# Patient Record
Sex: Female | Born: 1995 | Race: Black or African American | Hispanic: No | Marital: Single | Smoking: Former smoker
Health system: Southern US, Community
[De-identification: ages and names within clinical notes are randomized; demographics above are authoritative.]

## PROBLEM LIST (undated history)

## (undated) ENCOUNTER — Inpatient Hospital Stay (HOSPITAL_COMMUNITY): Payer: Self-pay

## (undated) DIAGNOSIS — O24419 Gestational diabetes mellitus in pregnancy, unspecified control: Secondary | ICD-10-CM

## (undated) DIAGNOSIS — N760 Acute vaginitis: Secondary | ICD-10-CM

## (undated) DIAGNOSIS — F909 Attention-deficit hyperactivity disorder, unspecified type: Secondary | ICD-10-CM

## (undated) DIAGNOSIS — E669 Obesity, unspecified: Secondary | ICD-10-CM

## (undated) DIAGNOSIS — L0591 Pilonidal cyst without abscess: Secondary | ICD-10-CM

## (undated) HISTORY — DX: Attention-deficit hyperactivity disorder, unspecified type: F90.9

## (undated) HISTORY — PX: NO PAST SURGERIES: SHX2092

## (undated) HISTORY — PX: PILONIDAL CYST EXCISION: SHX744

## (undated) NOTE — *Deleted (*Deleted)
Postpartum Discharge Summary  Date of Service updated***     Patient Name: Kelly Willis DOB: 1995/11/25 MRN: [id number]  Date of admission: 12/08/2019 Delivery date:12/08/2019  Delivering provider: PACE, Perlie Gold C  Date of discharge: 12/08/2019  Admitting diagnosis: Normal labor [O80, Z37.9] Intrauterine pregnancy: [redacted]w[redacted]d     Secondary diagnosis:  Active Problems:   Normal labor  Additional problems:     Discharge diagnosis: Term Pregnancy Delivered                                              Post partum procedures:NA Augmentation: NA Complications: None  Hospital course: Onset of Labor With Vaginal Delivery      37 y.o. yo [name] at [redacted]w[redacted]d was admitted in Active Labor on 12/08/2019. Patient had an uncomplicated labor course as follows:  Membrane Rupture Time/Date: 12:41 PM ,12/08/2019   Delivery Method:Vaginal, Spontaneous  Episiotomy:  NA Lacerations:    Small perineal, hemostatic and not repaired Patient had an uncomplicated postpartum course.  She is ambulating, tolerating a regular diet, passing flatus, and urinating well. Patient is discharged home in stable condition on 12/08/19.  Newborn Data: Birth date:12/08/2019  Birth time:5:52 PM  Gender:Female  Living status:Living  Apgars: , 9, 9  Weight:   Magnesium Sulfate received: No BMZ received: {BMZ received:30440023} Rhophylac:{Rhophylac received:30440032} JYN:{WGN:56213086} T-DaP:Not given while pregnant Flu: Not given during pregnancy Transfusion:{Transfusion received:30440034}  Physical exam  Vitals:   12/08/19 1759 12/08/19 1803 12/08/19 1814 12/08/19 1818  BP: 103/89 105/72 (!) 155/80 (!) 148/73  Pulse: 83 95 97 88  Resp:    16  Temp:    99 F (37.2 C)  TempSrc:    Axillary  SpO2:      Weight:      Height:       General: {Exam; general:21111117} Lochia: {Desc; appropriate/inappropriate:30686::"appropriate"} Uterine Fundus: {Desc; firm/soft:30687} Incision: {Exam; incision:21111123}  DVT Evaluation: {Exam; dvt:2111122} Labs: Lab Results  Component Value Date   WBC 8.8 12/08/2019   HGB 11.3 (L) 12/08/2019   HCT 37.2 12/08/2019   MCV 88.2 12/08/2019   PLT 257 12/08/2019   CMP Latest Ref Rng & Units 09/17/2019  Glucose 65 - 99 mg/dL 578(I)  BUN 6 - 20 mg/dL 7  Creatinine 6.96 - 2.95 mg/dL 2.84  Sodium 132 - 440 mmol/L 135  Potassium 3.5 - 5.2 mmol/L 3.8  Chloride 96 - 106 mmol/L 103  CO2 20 - 29 mmol/L 21  Calcium 8.7 - 10.2 mg/dL 8.9  Total Protein 6.0 - 8.5 g/dL 6.6  Total Bilirubin 0.0 - 1.2 mg/dL 0.3  Alkaline Phos 48 - 121 IU/L 121  AST 0 - 40 IU/L 18  ALT 0 - 32 IU/L 17   Edinburgh Score: Edinburgh Postnatal Depression Scale Screening Tool 01/04/2018  I have been able to laugh and see the funny side of things. 1  I have looked forward with enjoyment to things. 0  I have blamed myself unnecessarily when things went wrong. 3  I have been anxious or worried for no good reason. 2  I have felt scared or panicky for no good reason. 1  Things have been getting on top of me. 2  I have been so unhappy that I have had difficulty sleeping. 2  I have felt sad or miserable. 2  I have been so unhappy that I have been crying. 2  The thought of harming myself has occurred to me. 0  Edinburgh Postnatal Depression Scale Total 15     After visit meds:  Allergies as of 12/08/2019      Reactions   Pepto-bismol [bismuth Subsalicylate] Nausea And Vomiting    Med Rec must be completed prior to using this [facility]***        Discharge home in stable condition Infant Feeding: {Baby feeding:23562} Infant Disposition:{CHL IP OB HOME WITH ZOXWRU:04540} Discharge instruction: per After Visit Summary and Postpartum booklet. Activity: Advance as tolerated. Pelvic rest for 6 weeks.  Diet: {OB JWJX:91478295} Future Appointments:No future appointments. Follow up Visit:   Please schedule this patient for a In person postpartum visit in 4 weeks with the following  provider: MD. Additional Postpartum F/U:BP check 1 week  High risk pregnancy complicated by: HTN Delivery mode:  Vaginal, Spontaneous  Anticipated Birth Control:  desires BTL   12/08/2019 Kelly Willis, CNM

---
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
## 2014-06-12 DIAGNOSIS — L0591 Pilonidal cyst without abscess: Secondary | ICD-10-CM

## 2014-06-12 HISTORY — DX: Pilonidal cyst without abscess: L05.91

## 2014-07-04 ENCOUNTER — Encounter (HOSPITAL_BASED_OUTPATIENT_CLINIC_OR_DEPARTMENT_OTHER): Payer: Self-pay | Admitting: *Deleted

## 2014-07-08 ENCOUNTER — Ambulatory Visit (HOSPITAL_BASED_OUTPATIENT_CLINIC_OR_DEPARTMENT_OTHER): Payer: 59 | Admitting: Anesthesiology

## 2014-07-08 ENCOUNTER — Emergency Department (HOSPITAL_COMMUNITY)
Admission: EM | Admit: 2014-07-08 | Discharge: 2014-07-09 | Disposition: A | Discharge status: Home / Self Care | Payer: 59 | Attending: Emergency Medicine | Admitting: Emergency Medicine

## 2014-07-08 ENCOUNTER — Encounter (HOSPITAL_BASED_OUTPATIENT_CLINIC_OR_DEPARTMENT_OTHER): Payer: Self-pay | Admitting: Anesthesiology

## 2014-07-08 ENCOUNTER — Encounter (HOSPITAL_BASED_OUTPATIENT_CLINIC_OR_DEPARTMENT_OTHER)
Admission: RE | Disposition: A | Discharge status: Home / Self Care | Payer: Self-pay | Source: Ambulatory Visit | Attending: General Surgery

## 2014-07-08 ENCOUNTER — Ambulatory Visit (HOSPITAL_BASED_OUTPATIENT_CLINIC_OR_DEPARTMENT_OTHER)
Admission: RE | Admit: 2014-07-08 | Discharge: 2014-07-08 | Disposition: A | Discharge status: Home / Self Care | Payer: 59 | Source: Ambulatory Visit | Attending: General Surgery | Admitting: General Surgery

## 2014-07-08 ENCOUNTER — Encounter (HOSPITAL_COMMUNITY): Payer: Self-pay | Admitting: Emergency Medicine

## 2014-07-08 DIAGNOSIS — Y838 Other surgical procedures as the cause of abnormal reaction of the patient, or of later complication, without mention of misadventure at the time of the procedure: Secondary | ICD-10-CM | POA: Diagnosis not present

## 2014-07-08 DIAGNOSIS — L7621 Postprocedural hemorrhage and hematoma of skin and subcutaneous tissue following a dermatologic procedure: Secondary | ICD-10-CM | POA: Diagnosis not present

## 2014-07-08 DIAGNOSIS — Z720 Tobacco use: Secondary | ICD-10-CM | POA: Diagnosis not present

## 2014-07-08 DIAGNOSIS — L7622 Postprocedural hemorrhage and hematoma of skin and subcutaneous tissue following other procedure: Secondary | ICD-10-CM

## 2014-07-08 DIAGNOSIS — E669 Obesity, unspecified: Secondary | ICD-10-CM | POA: Insufficient documentation

## 2014-07-08 DIAGNOSIS — L0591 Pilonidal cyst without abscess: Secondary | ICD-10-CM | POA: Insufficient documentation

## 2014-07-08 HISTORY — PX: PILONIDAL CYST EXCISION: SHX744

## 2014-07-08 HISTORY — DX: Pilonidal cyst without abscess: L05.91

## 2014-07-08 LAB — POCT HEMOGLOBIN-HEMACUE: HEMOGLOBIN: 12.4 g/dL (ref 12.0–15.0)

## 2014-07-08 SURGERY — EXCISION, PILONIDAL CYST, EXTENSIVE
Anesthesia: General | Site: Buttocks

## 2014-07-08 MED ORDER — FENTANYL CITRATE (PF) 100 MCG/2ML IJ SOLN
INTRAMUSCULAR | Status: DC | PRN
  Administered 2014-07-08 (×4): 50 ug via INTRAVENOUS

## 2014-07-08 MED ORDER — SUCCINYLCHOLINE CHLORIDE 20 MG/ML IJ SOLN
INTRAMUSCULAR | Status: DC | PRN
  Administered 2014-07-08: 50 mg via INTRAVENOUS

## 2014-07-08 MED ORDER — SODIUM CHLORIDE 0.9 % IJ SOLN: 3.0000 mL | INTRAMUSCULAR | Status: DC | PRN

## 2014-07-08 MED ORDER — ONDANSETRON HCL 4 MG/2ML IJ SOLN: 4.0000 mg | Freq: Once | INTRAMUSCULAR | Status: DC | PRN

## 2014-07-08 MED ORDER — OXYCODONE HCL 5 MG PO TABS: 5.0000 mg | ORAL_TABLET | Freq: Once | ORAL | Status: DC | PRN

## 2014-07-08 MED ORDER — HYDROMORPHONE HCL 1 MG/ML IJ SOLN
0.2500 mg | INTRAMUSCULAR | Status: DC | PRN
  Administered 2014-07-08 (×2): 0.5 mg via INTRAVENOUS

## 2014-07-08 MED ORDER — BUPIVACAINE HCL (PF) 0.5 % IJ SOLN
INTRAMUSCULAR | Status: DC | PRN
  Administered 2014-07-08: 10 mL

## 2014-07-08 MED ORDER — SUCCINYLCHOLINE CHLORIDE 20 MG/ML IJ SOLN
INTRAMUSCULAR | Status: AC
  Filled 2014-07-08: qty 3

## 2014-07-08 MED ORDER — OXYCODONE HCL 5 MG PO TABS: 5.0000 mg | ORAL_TABLET | ORAL | Status: DC | PRN

## 2014-07-08 MED ORDER — ACETAMINOPHEN 650 MG RE SUPP: 650.0000 mg | RECTAL | Status: DC | PRN

## 2014-07-08 MED ORDER — CEFAZOLIN SODIUM-DEXTROSE 2-3 GM-% IV SOLR
INTRAVENOUS | Status: DC | PRN
  Administered 2014-07-08: 2 g via INTRAVENOUS

## 2014-07-08 MED ORDER — ALBUTEROL SULFATE HFA 108 (90 BASE) MCG/ACT IN AERS
INHALATION_SPRAY | RESPIRATORY_TRACT | Status: DC | PRN
  Administered 2014-07-08: 2 via RESPIRATORY_TRACT

## 2014-07-08 MED ORDER — BUPIVACAINE HCL (PF) 0.5 % IJ SOLN
INTRAMUSCULAR | Status: AC
  Filled 2014-07-08: qty 30

## 2014-07-08 MED ORDER — LIDOCAINE HCL (CARDIAC) 20 MG/ML IV SOLN
INTRAVENOUS | Status: DC | PRN
  Administered 2014-07-08: 50 mg via INTRAVENOUS

## 2014-07-08 MED ORDER — LACTATED RINGERS IV SOLN
INTRAVENOUS | Status: DC
  Administered 2014-07-08 (×2): via INTRAVENOUS

## 2014-07-08 MED ORDER — HYDROMORPHONE HCL 1 MG/ML IJ SOLN
INTRAMUSCULAR | Status: AC
  Filled 2014-07-08: qty 1

## 2014-07-08 MED ORDER — MIDAZOLAM HCL 5 MG/5ML IJ SOLN
INTRAMUSCULAR | Status: DC | PRN
  Administered 2014-07-08: 2 mg via INTRAVENOUS

## 2014-07-08 MED ORDER — MIDAZOLAM HCL 2 MG/2ML IJ SOLN
INTRAMUSCULAR | Status: AC
  Filled 2014-07-08: qty 2

## 2014-07-08 MED ORDER — DEXAMETHASONE SODIUM PHOSPHATE 4 MG/ML IJ SOLN
INTRAMUSCULAR | Status: DC | PRN
  Administered 2014-07-08: 10 mg via INTRAVENOUS

## 2014-07-08 MED ORDER — PROPOFOL 10 MG/ML IV BOLUS
INTRAVENOUS | Status: DC | PRN
  Administered 2014-07-08: 200 mg via INTRAVENOUS

## 2014-07-08 MED ORDER — ACETAMINOPHEN 325 MG PO TABS: 650.0000 mg | ORAL_TABLET | ORAL | Status: DC | PRN

## 2014-07-08 MED ORDER — BUPIVACAINE HCL (PF) 0.25 % IJ SOLN
INTRAMUSCULAR | Status: AC
  Filled 2014-07-08: qty 30

## 2014-07-08 MED ORDER — FENTANYL CITRATE (PF) 100 MCG/2ML IJ SOLN
INTRAMUSCULAR | Status: AC
  Filled 2014-07-08: qty 6

## 2014-07-08 MED ORDER — MORPHINE SULFATE 2 MG/ML IJ SOLN: 2.0000 mg | INTRAMUSCULAR | Status: DC | PRN

## 2014-07-08 SURGICAL SUPPLY — 48 items
BENZOIN TINCTURE PRP APPL 2/3 (GAUZE/BANDAGES/DRESSINGS) IMPLANT
BLADE SURG 15 STRL LF DISP TIS (BLADE) ×1 IMPLANT
BLADE SURG 15 STRL SS (BLADE) ×2
BNDG GAUZE ELAST 4 BULKY (GAUZE/BANDAGES/DRESSINGS) ×3 IMPLANT
CANISTER SUCT 1200ML W/VALVE (MISCELLANEOUS) ×3 IMPLANT
CHLORAPREP W/TINT 26ML (MISCELLANEOUS) ×3 IMPLANT
CLEANER CAUTERY TIP 5X5 PAD (MISCELLANEOUS) ×1 IMPLANT
COVER BACK TABLE 60X90IN (DRAPES) ×3 IMPLANT
COVER MAYO STAND STRL (DRAPES) ×3 IMPLANT
DECANTER SPIKE VIAL GLASS SM (MISCELLANEOUS) IMPLANT
DRAIN CHANNEL 10F 3/8 F FF (DRAIN) IMPLANT
DRAIN PENROSE 1/4X12 LTX STRL (WOUND CARE) IMPLANT
DRAPE LAPAROTOMY T 102X78X121 (DRAPES) ×3 IMPLANT
DRAPE UTILITY XL STRL (DRAPES) ×3 IMPLANT
DRSG PAD ABDOMINAL 8X10 ST (GAUZE/BANDAGES/DRESSINGS) ×6 IMPLANT
ELECT REM PT RETURN 9FT ADLT (ELECTROSURGICAL) ×3
ELECTRODE REM PT RTRN 9FT ADLT (ELECTROSURGICAL) ×1 IMPLANT
EVACUATOR SILICONE 100CC (DRAIN) IMPLANT
GAUZE SPONGE 4X4 16PLY XRAY LF (GAUZE/BANDAGES/DRESSINGS) IMPLANT
GLOVE BIOGEL PI IND STRL 8.5 (GLOVE) ×1 IMPLANT
GLOVE BIOGEL PI INDICATOR 8.5 (GLOVE) ×2
GLOVE SURG SS PI 7.0 STRL IVOR (GLOVE) ×3 IMPLANT
GOWN STRL REUS W/ TWL LRG LVL3 (GOWN DISPOSABLE) ×1 IMPLANT
GOWN STRL REUS W/TWL LRG LVL3 (GOWN DISPOSABLE) ×2
NEEDLE HYPO 22GX1.5 SAFETY (NEEDLE) ×3 IMPLANT
NS IRRIG 1000ML POUR BTL (IV SOLUTION) ×3 IMPLANT
PACK BASIN DAY SURGERY FS (CUSTOM PROCEDURE TRAY) ×3 IMPLANT
PAD CLEANER CAUTERY TIP 5X5 (MISCELLANEOUS) ×2
PENCIL BUTTON HOLSTER BLD 10FT (ELECTRODE) ×3 IMPLANT
SPONGE GAUZE 4X4 12PLY STER LF (GAUZE/BANDAGES/DRESSINGS) IMPLANT
SUCTION FRAZIER TIP 10 FR DISP (SUCTIONS) IMPLANT
SUT CHROMIC 3 0 SH 27 (SUTURE) ×3 IMPLANT
SUT ETHILON 2 0 FS 18 (SUTURE) IMPLANT
SUT ETHILON 3 0 FSL (SUTURE) IMPLANT
SUT ETHILON 4 0 PS 2 18 (SUTURE) IMPLANT
SUT MNCRL AB 3-0 PS2 18 (SUTURE) IMPLANT
SUT MON AB 2-0 CT1 36 (SUTURE) IMPLANT
SUT VIC AB 4-0 SH 18 (SUTURE) IMPLANT
SUT VICRYL 4-0 PS2 18IN ABS (SUTURE) ×3 IMPLANT
SWAB COLLECTION DEVICE MRSA (MISCELLANEOUS) IMPLANT
SYR BULB 3OZ (MISCELLANEOUS) ×3 IMPLANT
SYR CONTROL 10ML LL (SYRINGE) ×3 IMPLANT
TAPE CLOTH 3X10 TAN LF (GAUZE/BANDAGES/DRESSINGS) IMPLANT
TOWEL OR 17X24 6PK STRL BLUE (TOWEL DISPOSABLE) ×3 IMPLANT
TOWEL OR NON WOVEN STRL DISP B (DISPOSABLE) ×3 IMPLANT
TUBE CONNECTING 20'X1/4 (TUBING) ×1
TUBE CONNECTING 20X1/4 (TUBING) ×2 IMPLANT
YANKAUER SUCT BULB TIP NO VENT (SUCTIONS) ×3 IMPLANT

## 2014-07-08 NOTE — Anesthesia Postprocedure Evaluation (Signed)
Anesthesia Post Note  Patient: Kelly Willis  Procedure(s) Performed: Procedure(s) (LRB): CYST EXCISION PILONIDAL  (N/A)  Anesthesia type: General  Patient location: PACU  Post pain: Pain level controlled and Adequate analgesia  Post assessment: Post-op Vital signs reviewed, Patient's Cardiovascular Status Stable, Respiratory Function Stable, Patent Airway and Pain level controlled  Last Vitals:  Filed Vitals:   07/08/14 1345  BP: 131/82  Pulse: 93  Temp:   Resp: 19    Post vital signs: Reviewed and stable  Level of consciousness: awake, alert  and oriented  Complications: No apparent anesthesia complications

## 2014-07-08 NOTE — H&P (Signed)
Fatima SangerChaneldra Kosloski  The patient is a 19 year old female   Note:She has recurrent, infected pilonidal cyst disease and presents for elective pilonidal cystectomy.   Allergies  Pepto Bismol *ANTIDIARRHEALS* Nausea, Vomiting.  Prior to Admission medications   Not on File      Physical Exam  The physical exam findings are as follows: Note:General-morbidly obese female in no acute distress.  Gluteal cleft-open wound to the right of the midline is healed. There is some firmness present.    Assessment & Plan  PILONIDAL ABSCESS (685.0  L05.01) Impression: She has been having problems with recurrent infected pilonidal cysts/recurrent pilonidal abscess. She is in agreement with proceeding with extensive pilonidal cystectomy.  Plan: Extensive pilonidal cystectomy with healing by secondary intention. We went over the procedure and risks. The risks include but are not limited to bleeding, recurrent infection, recurrent pilonidal cyst disease, anesthesia, problems with wound healing. They all seem to understand this and agree with the plan.    Signed by Adolph Pollackodd J Deztiny Sarra, MD

## 2014-07-08 NOTE — Anesthesia Procedure Notes (Signed)
Procedure Name: Intubation Date/Time: 07/08/2014 12:16 PM Performed by: Genevieve NorlanderLINKA, Jessie Schrieber L Pre-anesthesia Checklist: Emergency Drugs available, Suction available, Patient being monitored, Patient identified and Timeout performed Patient Re-evaluated:Patient Re-evaluated prior to inductionOxygen Delivery Method: Circle System Utilized Preoxygenation: Pre-oxygenation with 100% oxygen Intubation Type: IV induction Ventilation: Mask ventilation without difficulty Laryngoscope Size: Miller and 2 Grade View: Grade II Tube type: Oral Number of attempts: 1 Airway Equipment and Method: Stylet and Oral airway Placement Confirmation: ETT inserted through vocal cords under direct vision,  positive ETCO2 and breath sounds checked- equal and bilateral Secured at: 21.5 cm Tube secured with: Tape Dental Injury: Teeth and Oropharynx as per pre-operative assessment

## 2014-07-08 NOTE — Op Note (Signed)
Operative Note  Kelly SangerChaneldra Arango female 19 y.o. 07/08/2014  PREOPERATIVE DX:  Recurrent infected pilonidal cyst  POSTOPERATIVE DX:  Same  PROCEDURE:   Extensive pilonidal cystectomy         Surgeon: Adolph PollackOSENBOWER,Shelitha Magley J   Assistants: none  Anesthesia: General endotracheal anesthesia  Indications:   This is a 19 year old female who is morbidly obese and has recurrent infected pilonidal cyst disease. She now presents for extensive pilonidal cystectomy with healing by secondary intention planned.    Procedure Detail:  She was seen in the holding area. She is brought to the operating room on the stretcher and the general anesthetic was administered. She was then turned prone on the operating table in padding placed appropriately. The buttocks and gluteal cleft area were sterilely prepped and draped.  Firm indurated area could be felt in the upper portion of the gluteal cleft small sinus tracts noted and a small incision just to the right of the midline. Local anesthetic consisting of half percent plain Marcaine was infiltrated in the skin and subcutaneous tissues around the gluteal cleft. An elliptical incision was made through the skin and subcutaneous tissue to valve the majority of the gluteal cleft ending proximal to the anal area. I dissected the subcutaneous tissue with electrocautery. There was firm scar and abnormal tissue all the way down to the fascia. This was 7 cm deep. I then excised all this tissue with electrocautery back to normal-appearing tissue. This specimen was sent to pathology. More local anesthetic was injected into the deep portions of the wound. Bleeding was controlled with electrocautery.  Once hemostasis was adequate, the wound was packed with an entire saline moistened Kerlix gauze. Montgomery straps were applied followed by dry dressings.  She tolerated the procedure without any apparent complications and was taken to the recovery room in satisfactory  condition.  Estimated Blood Loss:  150 ml         Specimens: pilonidal tissue        Complications:  * No complications entered in OR log *         Disposition: PACU - hemodynamically stable.         Condition: stable

## 2014-07-08 NOTE — Anesthesia Preprocedure Evaluation (Signed)
Anesthesia Evaluation  Patient identified by MRN, date of birth, ID band Patient awake    Reviewed: Allergy & Precautions, NPO status , Patient's Chart, lab work & pertinent test results  Airway Mallampati: II  TM Distance: >3 FB Neck ROM: Full    Dental  (+) Teeth Intact, Dental Advisory Given   Pulmonary Current Smoker,  breath sounds clear to auscultation        Cardiovascular Rhythm:Regular Rate:Normal     Neuro/Psych    GI/Hepatic   Endo/Other    Renal/GU      Musculoskeletal   Abdominal (+) + obese,   Peds  Hematology   Anesthesia Other Findings   Reproductive/Obstetrics                             Anesthesia Physical Anesthesia Plan  ASA: II  Anesthesia Plan: General   Post-op Pain Management:    Induction: Intravenous  Airway Management Planned: Oral ETT  Additional Equipment:   Intra-op Plan:   Post-operative Plan:   Informed Consent: I have reviewed the patients History and Physical, chart, labs and discussed the procedure including the risks, benefits and alternatives for the proposed anesthesia with the patient or authorized representative who has indicated his/her understanding and acceptance.   Dental advisory given  Plan Discussed with: CRNA and Anesthesiologist  Anesthesia Plan Comments: (Pilonidal cyst Obesity  Plan GA with oral ETT  Kelly Willis)        Anesthesia Quick Evaluation

## 2014-07-08 NOTE — Discharge Instructions (Addendum)
[facility] _______[facility]  RECTAL AREA SURGERY POST OP INSTRUCTIONS: POST OP INSTRUCTIONS  Always review your discharge instruction sheet given to you by the facility where your surgery was performed. IF YOU HAVE DISABILITY OR FAMILY LEAVE FORMS, YOU MUST BRING THEM TO THE OFFICE FOR PROCESSING.   DO NOT GIVE THEM TO YOUR DOCTOR.  1. A  prescription for pain medication may be given to you upon discharge.  Take your pain medication as prescribed, if needed.  If narcotic pain medicine is not needed, then you may take acetaminophen (Tylenol) or ibuprofen (Advil) as needed. 2. Take your usually prescribed medications unless otherwise directed. 3. If you need a refill on your pain medication, please contact your pharmacy.  They will contact our office to request authorization. Prescriptions will not be filled after 5 pm or on week-ends. 4. You should follow a light diet the first 48 hours after arrival home, such as soup and crackers, etc.  Be sure to include lots of fluids daily.  Resume your normal diet 2-3 days after surgery.. 5. Most patients will experience some swelling and discomfort in the rectal area. Ice packs help.  Swelling and discomfort can take many days to resolve.  6. It is common to experience some constipation if taking pain medication after surgery.  Increasing fluid intake and taking a stool softener (such as Colace) will usually help or prevent this problem from occurring.  A mild laxative (Milk of Magnesia or Miralax) should be taken according to package directions if there are no bowel movements after 48 hours. 7. Unless discharge instructions indicate otherwise, leave your bandage dry and in place until your office visit. 8. ACTIVITIES:  You may resume light daily activities beginning the next day--such as daily self-care, walking, climbing stairs-- as tolerated.  You may have sexual intercourse when it is comfortable.  Refrain from any heavy lifting or straining  until approved by your doctor. a. You may drive when you are no longer taking prescription pain medication, you can comfortably wear a seatbelt, and you can safely maneuver your car and apply brakes. b. RETURN TO WORK: : When released by doctor____________________ c.  9. You should see your doctor in the office for a follow-up appointment Tuesday, May 31 at 10:00 AM. 10. OTHER INSTRUCTIONS:  __________________________________________________________________________________________________________________________________________________________________________________________  WHEN TO CALL YOUR DOCTOR: 1. Fever over 101.0 2. Inability to urinate 3. Nausea and/or vomiting 4. Extreme swelling or bruising 5. Heavy bleeding from wound. 6. Increased pain, redness, or drainage from the incision 7. Constipation  The clinic staff is available to answer your questions during regular business hours.  Please dont hesitate to call and ask to speak to one of the nurses for clinical concerns.  If you have a medical emergency, go to the nearest emergency room or call 911.  [facility] is always on call at the hospital   [address], [address], [place], [place]  [id number] ?  [address] [address], [place], [place]   [id number] [phone] ? [phone] ? FAX [phone]) [phone] Web site: [email or web address]    Post Anesthesia Home Care Instructions  Activity: Get plenty of rest for the remainder of the day. A responsible adult should stay with you for 24 hours following the procedure.  For the next 24 hours, DO NOT: -Drive a car -Advertising copywriterperate machinery -Drink alcoholic beverages -Take any medication unless instructed by your physician -Make any legal decisions or sign important papers.  Meals: Start with liquid  foods such as gelatin or soup. Progress to regular foods as tolerated. Avoid greasy, spicy, heavy foods. If nausea and/or vomiting occur, drink only clear  liquids until the nausea and/or vomiting subsides. Call your physician if vomiting continues.  Special Instructions/Symptoms: Your throat may feel dry or sore from the anesthesia or the breathing tube placed in your throat during surgery. If this causes discomfort, gargle with warm salt water. The discomfort should disappear within 24 hours.  If you had a scopolamine patch placed behind your ear for the management of post- operative nausea and/or vomiting:  1. The medication in the patch is effective for 72 hours, after which it should be removed.  Wrap patch in a tissue and discard in the trash. Wash hands thoroughly with soap and water. 2. You may remove the patch earlier than 72 hours if you experience unpleasant side effects which may include dry mouth, dizziness or visual disturbances. 3. Avoid touching the patch. Wash your hands with soap and water after contact with the patch.

## 2014-07-08 NOTE — Transfer of Care (Signed)
Immediate Anesthesia Transfer of Care Note  Patient: Kelly Willis  Procedure(s) Performed: Procedure(s): CYST EXCISION PILONIDAL  (N/A)  Patient Location: PACU  Anesthesia Type:General  Level of Consciousness: awake and patient cooperative  Airway & Oxygen Therapy: Patient Spontanous Breathing and Patient connected to face mask oxygen  Post-op Assessment: Report given to RN and Post -op Vital signs reviewed and stable  Post vital signs: Reviewed and stable  Last Vitals:  Filed Vitals:   07/08/14 0830  BP: 134/69  Pulse: 79  Temp: 36.8 C  Resp: 20    Complications: No apparent anesthesia complications

## 2014-07-08 NOTE — ED Notes (Signed)
Pt arrived to the ED with a complaint of a post operative problem.  Pt had a cyst removed from her hip.  Pt states area has not stopped bleeding.  Pt states that she also has noticed blood in her eye.

## 2014-07-08 NOTE — Interval H&P Note (Signed)
History and Physical Interval Note:  07/08/2014 12:04 PM  Kelly Willis  has presented today for surgery, with the diagnosis of PILONIDAL CYST  The various methods of treatment have been discussed with the patient and family. After consideration of risks, benefits and other options for treatment, the patient has consented to  Procedure(s): CYST EXCISION PILONIDAL  (N/A) as a surgical intervention .  The patient's history has been reviewed, patient examined, no change in status, stable for surgery.  I have reviewed the patient's chart and labs.  Questions were answered to the patient's satisfaction.     Michaeljohn Biss JShela Commons

## 2014-07-09 NOTE — ED Provider Notes (Signed)
CSN: [id number]     Arrival date & time 07/08/14  2329 History   First MD Initiated Contact with Patient 07/09/14 0140     Chief Complaint  Patient presents with  . Post-op Problem     (Consider location/radiation/quality/duration/timing/severity/associated sxs/prior Treatment) HPI  Kelly Willis is a 19 y.o. female with past medical history of a pilonidal abscess status post resection earlier today presenting with bleeding. Patient states the bleeding never stopped after the same day surgery. She has bled through the dressings into her underwear. She denies any significant pain in the area. She is concerned for major postoperative bleeding. Patient also complains of bleeding in her right eye which appeared around 9:30 PM. She states she did have multiple episodes of coughing due to a sore throat from intubation. There is no pain in the eye or blurry vision. She has no neurological symptoms. She has no further complaints.  10 Systems reviewed and are negative for acute change except as noted in the HPI.    Past Medical History  Diagnosis Date  . Obesity   . Pilonidal cyst 06/2014   Past Surgical History  Procedure Laterality Date  . Pilonidal cyst excision     History reviewed. No pertinent family history. History  Substance Use Topics  . Smoking status: Current Every Day Smoker -- 2 years    Types: Cigarettes  . Smokeless tobacco: Never Used     Comment: 4 cig./day  . Alcohol Use: Yes     Comment: occasionally   OB History    Gravida Para Term Preterm AB TAB SAB Ectopic Multiple Living   1              Review of Systems    Allergies  Pepto-bismol  Home Medications   Prior to Admission medications   Medication Sig Start Date End Date Taking? Authorizing Provider  oxyCODONE (OXY IR/ROXICODONE) 5 MG immediate release tablet Take 1-2 tablets (5-10 mg total) by mouth every 4 (four) hours as needed. 07/08/14   Avel Peaceodd Rosenbower, MD   BP 141/85 mmHg  Pulse 111   Temp(Src) 98.3 F (36.8 C) (Oral)  Resp 18  SpO2 98%  LMP 06/28/2014 (Exact Date) Physical Exam  Constitutional: She is oriented to person, place, and time. She appears well-developed and well-nourished. No distress.  HENT:  Head: Normocephalic and atraumatic.  Nose: Nose normal.  Mouth/Throat: Oropharynx is clear and moist. No oropharyngeal exudate.  Eyes: Conjunctivae and EOM are normal. Pupils are equal, round, and reactive to light. No scleral icterus.  Right eye subconjunctival hemorrhage is seen at the 6:00 position.  Neck: Normal range of motion. Neck supple. No JVD present. No tracheal deviation present. No thyromegaly present.  Cardiovascular: Normal rate, regular rhythm and normal heart sounds.  Exam reveals no gallop and no friction rub.   No murmur heard. Pulmonary/Chest: Effort normal and breath sounds normal. No respiratory distress. She has no wheezes. She exhibits no tenderness.  Abdominal: Soft. Bowel sounds are normal. She exhibits no distension and no mass. There is no tenderness. There is no rebound and no guarding.  Musculoskeletal: Normal range of motion. She exhibits no edema or tenderness.  Lymphadenopathy:    She has no cervical adenopathy.  Neurological: She is alert and oriented to person, place, and time. No cranial nerve deficit. She exhibits normal muscle tone.  Skin: Skin is warm and dry. No rash noted. She is not diaphoretic. No erythema. No pallor.  Large 10cm wound with gauze dressing packed.  This was not removed.  There is good hemostasis and no evidence of active bleeding.  No significant TTP.  Nursing note and vitals reviewed.   ED Course  Procedures (including critical care time) Labs Review Labs Reviewed - No data to display  Imaging Review No results found.   EKG Interpretation None      MDM   Final diagnoses:  None   Patient presents to the ED for postop bleeding.  My exam reveals that this is now controlled with good hemostasis  from dressing.  Subconjunctival hemorrhage education given.  I spoke with Dr. Pollyann Kennedy who received a phone call from this patient earlier and advised them to come in.  I explained to him that I took down the dressing and there is good hemostasis present.  He also believes the patient is then safe for DC with fu on Tuesday in clinic.  Will give APD pads to use at home.  Return precautions given.  Her VS remain within her normal limits and she is safe for DC.    Tomasita Crumble, MD 07/09/14 [phone]

## 2014-07-09 NOTE — Discharge Instructions (Signed)
Pilonidal Cyst, Care After [name], your bleeding has stopped with the dressings reapplied. Change the top layer dressing only every day. See Dr. Kae Heller in clinic on Tuesday. If any symptoms worsen come back to the emergency department immediately. Thank you. A pilonidal cyst occurs when hairs get trapped (ingrown) beneath the skin in the crease between the buttocks over your sacrum (the bone under that crease). Pilonidal cysts are most common in young men with a lot of body hair. When the cyst breaks(ruptured) or leaks, fluid from the cyst may cause burning and itching. If the cyst becomes infected, it causes a painful swelling filled with pus (abscess). The pus and trapped hairs need to be removed (often by lancing) so that the infection can heal. The word pilonidal means hair nest. HOME CARE INSTRUCTIONS If the pilonidal sinus was NOT DRAINING OR LANCED:  Keep the area clean and dry. Bathe or shower daily. Wash the area well with a germ-killing soap. Hot tub baths may help prevent infection. Dry the area well with a towel.  Avoid tight clothing in order to keep area as moisture-free as possible.  Keep area between buttocks as free from hair as possible. A depilatory may be used.  Take antibiotics as directed.  Only take over-the-counter or prescription medicines for pain, discomfort, or fever as directed by your caregiver. If the cyst WAS INFECTED AND NEEDED TO BE DRAINED:  Your caregiver may have packed the wound with gauze to keep the wound open. This allows the wound to heal from the inside outward and continue to drain.  Return as directed for a wound check.  If you take tub baths or showers, repack the wound with gauze as directed following. Sponge baths are a good alternative. Sitz baths may be used three to four times a day or as directed.  If an antibiotic was ordered to fight the infection, take as directed.  Only take over-the-counter or prescription medicines for pain,  discomfort, or fever as directed by your caregiver.  If a drain was in place and removed, use sitz baths for 20 minutes 4 times per day. Clean the wound gently with mild unscented soap, pat dry, and then apply a dry dressing as directed. If you had surgery and IT WAS MARSUPIALIZED (LEFT OPEN):  Your wound was packed with gauze to keep the wound open. This allows the wound to heal from the inside outwards and continue draining. The changing of the dressing regularly also helps keep the wound clean.  Return as directed for a wound check.  If you take tub baths or showers, repack the wound with gauze as directed following. Sponge baths are a good alternative. Sitz baths can also be used. This may be done three to four times a day or as directed.  If an antibiotic was ordered to fight the infection, take as directed.  Only take over-the-counter or prescription medicines for pain, discomfort, or fever as directed by your caregiver.  If you had surgery and the wound was closed you may care for it as directed. This generally includes keeping it dry and clean and dressing it as directed. SEEK MEDICAL CARE IF:   You have increased pain, swelling, redness, drainage, or bleeding from the area.  You have a fever.  You have muscles aches, dizziness, or a general ill feeling. Document Released: 02/28/2006 Document Revised: 09/30/2012 Document Reviewed: 05/15/2006 [facility] Patient Information 2015 [place], [place]. This information is not intended to replace advice given to you by your  health care provider. Make sure you discuss any questions you have with your health care provider. Subconjunctival Hemorrhage Your exam shows you have a subconjunctival hemorrhage. This is a harmless collection of blood covering a portion of the white of the eye. This condition may be due to injury or to straining (lifting, sneezing, or coughing). Often, there is no known cause. Subconjunctival blood does not cause pain or  vision problems. This condition needs no treatment. It will take 1 to 2 weeks for the blood to dissolve. If you take aspirin or Coumadin on a daily basis or if you have high blood pressure, you should check with your doctor about the need for further treatment. Please call your doctor if you have problems with your vision, pain around the eye, or any other concerns about your condition. Document Released: 03/07/2004 Document Revised: 04/22/2011 Document Reviewed: 12/26/2008 [facility] Patient Information 2015 [place]. This information is not intended to replace advice given to you by your health care provider. Make sure you discuss any questions you have with your health care provider.

## 2014-07-12 ENCOUNTER — Encounter (HOSPITAL_BASED_OUTPATIENT_CLINIC_OR_DEPARTMENT_OTHER): Payer: Self-pay | Admitting: General Surgery

## 2015-02-12 NOTE — L&D Delivery Note (Signed)
Delivery Note At 1:24 AM a viable female was delivered via Vaginal, Spontaneous Delivery (Presentation:OA ;restituted to LOA  ).  APGAR: 8, 9; weight pending .   Placenta status:intact ,  Delivered via Tomasa BlaseSchultz.  Cord: three vessels  with the following complications: .  Cord pH: N/A  Anesthesia:  epidural Episiotomy: None Lacerations: None Suture Repair: N/A Est. Blood Loss (mL): 100  Mom to postpartum.  Baby to Couplet care / Skin to Skin.  Clayton BiblesSamantha Weinhold, SNM 01/21/2016, 2:23 AM  Midwife attestation: I was gloved and present for delivery in its entirety and I agree with the above student's note.  LEFTWICH-KIRBY, Marielys Trinidad, CNM 10:11 PM

## 2015-08-03 ENCOUNTER — Ambulatory Visit: Payer: 59 | Admitting: Certified Nurse Midwife

## 2015-08-04 ENCOUNTER — Encounter: Payer: Self-pay | Admitting: Certified Nurse Midwife

## 2015-08-04 NOTE — Progress Notes (Signed)
Chart opened in error.    Patient cancelled appointment.

## 2015-08-09 ENCOUNTER — Encounter: Payer: 59 | Admitting: Certified Nurse Midwife

## 2015-08-23 ENCOUNTER — Encounter: Payer: Self-pay | Admitting: Certified Nurse Midwife

## 2015-08-23 ENCOUNTER — Ambulatory Visit (INDEPENDENT_AMBULATORY_CARE_PROVIDER_SITE_OTHER): Payer: 59 | Admitting: Certified Nurse Midwife

## 2015-08-23 VITALS — BP 131/86 | HR 109 | Temp 98.1°F | Wt 299.8 lb

## 2015-08-23 DIAGNOSIS — O0932 Supervision of pregnancy with insufficient antenatal care, second trimester: Secondary | ICD-10-CM | POA: Diagnosis not present

## 2015-08-23 DIAGNOSIS — R51 Headache: Secondary | ICD-10-CM

## 2015-08-23 DIAGNOSIS — R519 Headache, unspecified: Secondary | ICD-10-CM

## 2015-08-23 DIAGNOSIS — O26892 Other specified pregnancy related conditions, second trimester: Secondary | ICD-10-CM

## 2015-08-23 DIAGNOSIS — Z3400 Encounter for supervision of normal first pregnancy, unspecified trimester: Secondary | ICD-10-CM | POA: Insufficient documentation

## 2015-08-23 DIAGNOSIS — Z3402 Encounter for supervision of normal first pregnancy, second trimester: Secondary | ICD-10-CM

## 2015-08-23 DIAGNOSIS — O0930 Supervision of pregnancy with insufficient antenatal care, unspecified trimester: Secondary | ICD-10-CM | POA: Insufficient documentation

## 2015-08-23 DIAGNOSIS — Z331 Pregnant state, incidental: Secondary | ICD-10-CM | POA: Diagnosis not present

## 2015-08-23 DIAGNOSIS — Z1389 Encounter for screening for other disorder: Secondary | ICD-10-CM | POA: Diagnosis not present

## 2015-08-23 LAB — POCT URINALYSIS DIPSTICK
Bilirubin, UA: NEGATIVE
Blood, UA: NEGATIVE
Glucose, UA: NEGATIVE
Nitrite, UA: NEGATIVE
PH UA: 5
SPEC GRAV UA: 1.02
Urobilinogen, UA: NEGATIVE

## 2015-08-23 MED ORDER — BUTALBITAL-APAP-CAFFEINE 50-325-40 MG PO TABS: 1.0000 | ORAL_TABLET | Freq: Four times a day (QID) | ORAL | Status: DC | PRN

## 2015-08-23 MED ORDER — VITAFOL GUMMIES 3.33-0.333-34.8 MG PO CHEW: 3.0000 | CHEWABLE_TABLET | Freq: Every day | ORAL | Status: DC

## 2015-08-23 NOTE — Progress Notes (Signed)
Subjective:    Kelly Willis is being seen today for her first obstetrical visit.  This is not a planned pregnancy. She is at [redacted]w[redacted]d gestation. Her obstetrical history is significant for obesity. Stopped smoking around 12 weeks of pregnancy.  Relationship with FOB: significant other, living together, [place]. Patient does intend to breast feed. Pregnancy history fully reviewed.  The information documented in the HPI was reviewed and verified.  Menstrual History: OB History    Gravida Para Term Preterm AB TAB SAB Ectopic Multiple Living   1               Menarche age: 20 years of age  Patient's last menstrual period was 04/15/2015.    Past Medical History  Diagnosis Date  . Obesity   . Pilonidal cyst 06/2014  . ADHD (attention deficit hyperactivity disorder)     Past Surgical History  Procedure Laterality Date  . Pilonidal cyst excision    . Pilonidal cyst excision N/A 07/08/2014    Procedure: CYST EXCISION PILONIDAL ;  Surgeon: [name], MD;  [facility]  [facility];  Service: General;  Laterality: N/A;     (Not in a hospital admission) Allergies  Allergen Reactions  . Pepto-Bismol [Bismuth Subsalicylate] Nausea And Vomiting    Social History  Substance Use Topics  . Smoking status: Current Every Day Smoker -- 2 years    Types: Cigarettes  . Smokeless tobacco: Never Used     Comment: 4 cig./day  . Alcohol Use: Yes     Comment: occasionally    Family History  Problem Relation Age of Onset  . Hypertension Maternal Grandfather      Review of Systems Constitutional: negative for weight loss Gastrointestinal: negative for vomiting Genitourinary:negative for genital lesions and vaginal discharge and dysuria Musculoskeletal:negative for back pain Behavioral/Psych: negative for abusive relationship, depression, illegal drug usage and tobacco use    Objective:    BP 131/86 mmHg  Pulse 109  Temp(Src) 98.1 F (36.7 C)  Wt 299 lb 12.8 oz (135.988 kg)   LMP 04/15/2015 General Appearance:    Alert, cooperative, no distress, appears stated age  Head:    Normocephalic, without obvious abnormality, atraumatic  Eyes:    PERRL, conjunctiva/corneas clear, EOM's intact, fundi    benign, both eyes  Ears:    Normal TM's and external ear canals, both ears  Nose:   Nares normal, septum midline, mucosa normal, no drainage    or sinus tenderness  Throat:   Lips, mucosa, and tongue normal; teeth and gums normal  Neck:   Supple, symmetrical, trachea midline, no adenopathy;    thyroid:  no enlargement/tenderness/nodules; no carotid   bruit or JVD  Back:     Symmetric, no curvature, ROM normal, no CVA tenderness  Lungs:     Clear to auscultation bilaterally, respirations unlabored  Chest Wall:    No tenderness or deformity   Heart:    Regular rate and rhythm, S1 and S2 normal, no murmur, rub   or gallop  Breast Exam:    No tenderness, masses, or nipple abnormality  Abdomen:     Soft, non-tender, bowel sounds active all four quadrants,    no masses, no organomegaly  Genitalia:    Normal female without lesion, discharge or tenderness  Extremities:   Extremities normal, atraumatic, no cyanosis or edema  Pulses:   2+ and symmetric all extremities  Skin:   Skin color, texture, turgor normal, no rashes or lesions  Lymph nodes:  Cervical, supraclavicular, and axillary nodes normal  Neurologic:   CNII-XII intact, normal strength, sensation and reflexes    throughout      Lab Review Urine pregnancy test Labs reviewed yes Radiologic studies reviewed no Assessment:    Pregnancy at [redacted]w[redacted]d weeks   Late to prenatal care  HA in pregnancy Plan:    [place] @ MFM Prenatal vitamins.  Counseling provided regarding continued use of seat belts, cessation of alcohol consumption, smoking or use of illicit drugs; infection precautions i.e., influenza/TDAP immunizations, toxoplasmosis,CMV, parvovirus, listeria and varicella; workplace safety, exercise during pregnancy;  routine dental care, safe medications, sexual activity, hot tubs, saunas, pools, travel, caffeine use, fish and methlymercury, potential toxins, hair treatments, varicose veins Weight gain recommendations per IOM guidelines reviewed: underweight/BMI< 18.5--> gain 28 - 40 lbs; normal weight/BMI 18.5 - 24.9--> gain 25 - 35 lbs; overweight/BMI 25 - 29.9--> gain 15 - 25 lbs; obese/BMI >30->gain  11 - 20 lbs Problem list reviewed and updated. FIRST/CF mutation testing/NIPT/QUAD SCREEN/fragile X/Ashkenazi Jewish population testing/Spinal muscular atrophy discussed: ordered. Role of ultrasound in pregnancy discussed; fetal survey: ordered. Amniocentesis discussed: not indicated. VBAC calculator score: VBAC consent form provided Meds ordered this encounter  Medications  . Prenatal Vit-Fe Phos-FA-Omega (VITAFOL GUMMIES) 3.33-0.333-34.8 MG CHEW    Sig: Chew 3 tablets by mouth daily.    Dispense:  90 tablet    Refill:  12  . butalbital-acetaminophen-caffeine (FIORICET) 50-325-40 MG tablet    Sig: Take 1-2 tablets by mouth every 6 (six) hours as needed for headache.    Dispense:  45 tablet    Refill:  4   Orders Placed This Encounter  Procedures  . Culture, OB Urine  . [place] MFM OB COMP + 14 WK    Standing Status: Future     Number of Occurrences:      Standing Expiration Date: 10/23/2016    Order Specific Question:  Reason for Exam (SYMPTOM  OR DIAGNOSIS REQUIRED)    Answer:  morbid maternal obesity, late prenatal care    Order Specific Question:  Preferred imaging location?    Answer:  [facility]  . Hemoglobinopathy evaluation  . TSH  . HIV antibody  . Varicella zoster antibody, IgG  . Prenatal Profile I  . MaterniT21 PLUS Core+SCA    Order Specific Question:  Is the patient insulin dependent?    Answer:  No    Order Specific Question:  Please enter gestational age. This should be expressed as weeks AND days, i.e. 16w 6d. Enter weeks here. Enter days in next question.    Answer:  40     Order Specific Question:  Please enter gestational age. This should be expressed as weeks AND days, i.e. 16w 6d. Enter days here. Enter weeks in previous question.    Answer:  4    Order Specific Question:  How was gestational age calculated?    Answer:  LMP    Order Specific Question:  Please give the date of LMP OR Ultrasound OR Estimated date of delivery.    Answer:  01/20/2016    Order Specific Question:  Number of Fetuses (Type of Pregnancy):    Answer:  1    Order Specific Question:  Indications for performing the test? (please choose all that apply):    Answer:  Routine screening    Order Specific Question:  Other Indications? (Y=Yes, N=No)    Answer:  N    Order Specific Question:  If this is a repeat specimen, please indicate the  reason:    Answer:  Not indicated    Order Specific Question:  Please specify the patient's race: (C=White/Caucasion, B=Black, I=Native American, A=Asian, H=Hispanic, O=Other, U=Unknown)    Answer:  B    Order Specific Question:  Donor Egg - indicate if the egg was obtained from in vitro fertilization.    Answer:  N    Order Specific Question:  Age of Egg Donor.    Answer:  [age]    Order Specific Question:  Prior Down Syndrome/ONTD screening during current pregnancy.    Answer:  N    Order Specific Question:  Prior First Trimester Testing    Answer:  N    Order Specific Question:  Prior Second Trimester Testing    Answer:  N    Order Specific Question:  Family History of Neural Tube Defects    Answer:  N    Order Specific Question:  Prior Pregnancy with Down Syndrome    Answer:  N    Order Specific Question:  Please give the patient's weight (in pounds)    Answer:  300  . POCT urinalysis dipstick    Follow up in 4 weeks. 50% of 30 min visit spent on counseling and coordination of care.

## 2015-08-23 NOTE — Progress Notes (Signed)
Pt states that she has had some light spotting 3 days ago. Pt states one time occurrence. Pt states that she has been having frequent HA.

## 2015-08-23 NOTE — Addendum Note (Signed)
Addended by: Marya LandryFOSTER, Linley Moskal D on: 08/23/2015 05:09 PM   Modules accepted: Orders

## 2015-08-25 ENCOUNTER — Other Ambulatory Visit: Payer: Self-pay | Admitting: Certified Nurse Midwife

## 2015-08-25 DIAGNOSIS — B3731 Acute candidiasis of vulva and vagina: Secondary | ICD-10-CM

## 2015-08-25 DIAGNOSIS — B373 Candidiasis of vulva and vagina: Secondary | ICD-10-CM

## 2015-08-25 LAB — PAP IG W/ RFLX HPV ASCU: PAP SMEAR COMMENT: 0

## 2015-08-25 LAB — URINE CULTURE, OB REFLEX

## 2015-08-25 LAB — CULTURE, OB URINE

## 2015-08-25 MED ORDER — FLUCONAZOLE 100 MG PO TABS: 100.0000 mg | ORAL_TABLET | Freq: Once | ORAL | Status: DC

## 2015-08-25 MED ORDER — TERCONAZOLE 0.8 % VA CREA: 1.0000 | TOPICAL_CREAM | Freq: Every day | VAGINAL | Status: DC

## 2015-08-26 LAB — NUSWAB VG+, CANDIDA 6SP
CANDIDA LUSITANIAE, NAA: NEGATIVE
CANDIDA PARAPSILOSIS, NAA: NEGATIVE
CHLAMYDIA TRACHOMATIS, NAA: NEGATIVE
Candida albicans, NAA: POSITIVE — AB
Candida glabrata, NAA: NEGATIVE
Candida krusei, NAA: NEGATIVE
Candida tropicalis, NAA: NEGATIVE
Neisseria gonorrhoeae, NAA: NEGATIVE
Trich vag by NAA: POSITIVE — AB

## 2015-08-29 ENCOUNTER — Other Ambulatory Visit: Payer: Self-pay | Admitting: Certified Nurse Midwife

## 2015-08-29 DIAGNOSIS — A599 Trichomoniasis, unspecified: Secondary | ICD-10-CM

## 2015-08-29 DIAGNOSIS — B3731 Acute candidiasis of vulva and vagina: Secondary | ICD-10-CM

## 2015-08-29 DIAGNOSIS — B373 Candidiasis of vulva and vagina: Secondary | ICD-10-CM

## 2015-08-29 MED ORDER — TINIDAZOLE 500 MG PO TABS: 2.0000 g | ORAL_TABLET | Freq: Every day | ORAL | Status: AC

## 2015-08-29 MED ORDER — TERCONAZOLE 0.8 % VA CREA: 1.0000 | TOPICAL_CREAM | Freq: Every day | VAGINAL | Status: DC

## 2015-08-29 MED ORDER — FLUCONAZOLE 100 MG PO TABS: 100.0000 mg | ORAL_TABLET | Freq: Once | ORAL | Status: DC

## 2015-08-31 ENCOUNTER — Other Ambulatory Visit: Payer: Self-pay | Admitting: Certified Nurse Midwife

## 2015-08-31 ENCOUNTER — Ambulatory Visit (HOSPITAL_COMMUNITY)
Admission: RE | Admit: 2015-08-31 | Discharge: 2015-08-31 | Disposition: A | Discharge status: Home / Self Care | Payer: 59 | Source: Ambulatory Visit | Attending: Certified Nurse Midwife | Admitting: Certified Nurse Midwife

## 2015-08-31 ENCOUNTER — Telehealth: Payer: Self-pay | Admitting: *Deleted

## 2015-08-31 DIAGNOSIS — Z3689 Encounter for other specified antenatal screening: Secondary | ICD-10-CM

## 2015-08-31 DIAGNOSIS — E669 Obesity, unspecified: Secondary | ICD-10-CM | POA: Insufficient documentation

## 2015-08-31 DIAGNOSIS — O99212 Obesity complicating pregnancy, second trimester: Secondary | ICD-10-CM | POA: Diagnosis not present

## 2015-08-31 DIAGNOSIS — Z3A19 19 weeks gestation of pregnancy: Secondary | ICD-10-CM

## 2015-08-31 DIAGNOSIS — O283 Abnormal ultrasonic finding on antenatal screening of mother: Secondary | ICD-10-CM | POA: Diagnosis not present

## 2015-08-31 DIAGNOSIS — O0932 Supervision of pregnancy with insufficient antenatal care, second trimester: Secondary | ICD-10-CM | POA: Insufficient documentation

## 2015-08-31 DIAGNOSIS — Z3402 Encounter for supervision of normal first pregnancy, second trimester: Secondary | ICD-10-CM

## 2015-08-31 LAB — MATERNIT21 PLUS CORE+SCA
CHROMOSOME 21: NEGATIVE
Chromosome 13: NEGATIVE
Chromosome 18: NEGATIVE
PDF: 0
Y Chromosome: NOT DETECTED

## 2015-08-31 LAB — PRENATAL PROFILE I(LABCORP)
BASOS ABS: 0 10*3/uL (ref 0.0–0.2)
Basos: 0 %
EOS (ABSOLUTE): 0.1 10*3/uL (ref 0.0–0.4)
Eos: 1 %
HEMATOCRIT: 31.8 % — AB (ref 34.0–46.6)
HEMOGLOBIN: 10.4 g/dL — AB (ref 11.1–15.9)
Hepatitis B Surface Ag: NEGATIVE
IMMATURE GRANULOCYTES: 0 %
Immature Grans (Abs): 0 10*3/uL (ref 0.0–0.1)
Lymphocytes Absolute: 2.3 10*3/uL (ref 0.7–3.1)
Lymphs: 29 %
MCH: 26.6 pg (ref 26.6–33.0)
MCHC: 32.7 g/dL (ref 31.5–35.7)
MCV: 81 fL (ref 79–97)
MONOS ABS: 0.6 10*3/uL (ref 0.1–0.9)
Monocytes: 7 %
NEUTROS ABS: 5 10*3/uL (ref 1.4–7.0)
Neutrophils: 63 %
PLATELETS: 285 10*3/uL (ref 150–379)
RBC: 3.91 x10E6/uL (ref 3.77–5.28)
RDW: 13.5 % (ref 12.3–15.4)
RPR Ser Ql: NONREACTIVE
Rh Factor: POSITIVE
Rubella Antibodies, IGG: 6.16 index (ref >0.99)
WBC: 8 10*3/uL (ref 3.4–10.8)

## 2015-08-31 LAB — AB SCR+ANTIBODY ID: ANTIBODY SCREEN: POSITIVE — AB

## 2015-08-31 LAB — HEMOGLOBINOPATHY EVALUATION
HEMOGLOBIN A2 QUANTITATION: 2.2 % (ref 0.7–3.1)
HEMOGLOBIN F QUANTITATION: 0 % (ref 0.0–2.0)
HGB A: 97.8 % (ref 94.0–98.0)
HGB C: 0 %
HGB S: 0 %

## 2015-08-31 LAB — HIV ANTIBODY (ROUTINE TESTING W REFLEX): HIV Screen 4th Generation wRfx: NONREACTIVE

## 2015-08-31 LAB — TSH: TSH: 2.38 u[IU]/mL (ref 0.450–4.500)

## 2015-08-31 LAB — VARICELLA ZOSTER ANTIBODY, IGG: VARICELLA: 3085 {index} (ref >165)

## 2015-08-31 NOTE — Telephone Encounter (Signed)
See phone note for this encounter. 

## 2015-09-07 ENCOUNTER — Other Ambulatory Visit: Payer: Self-pay | Admitting: *Deleted

## 2015-09-07 DIAGNOSIS — A599 Trichomoniasis, unspecified: Secondary | ICD-10-CM

## 2015-09-07 DIAGNOSIS — B9689 Other specified bacterial agents as the cause of diseases classified elsewhere: Secondary | ICD-10-CM

## 2015-09-07 DIAGNOSIS — N760 Acute vaginitis: Principal | ICD-10-CM

## 2015-09-07 MED ORDER — METRONIDAZOLE 500 MG PO TABS: 500.0000 mg | ORAL_TABLET | Freq: Two times a day (BID) | ORAL | 0 refills | Status: DC

## 2015-09-07 NOTE — Progress Notes (Unsigned)
Rx for Tinidazole was changed to Flagyl due to insurance coverage.

## 2015-09-20 ENCOUNTER — Encounter: Payer: 59 | Admitting: Certified Nurse Midwife

## 2015-09-28 ENCOUNTER — Ambulatory Visit (HOSPITAL_COMMUNITY)
Admission: RE | Admit: 2015-09-28 | Discharge: 2015-09-28 | Disposition: A | Discharge status: Home / Self Care | Payer: 59 | Source: Ambulatory Visit | Attending: Certified Nurse Midwife | Admitting: Certified Nurse Midwife

## 2015-09-28 ENCOUNTER — Other Ambulatory Visit (HOSPITAL_COMMUNITY): Payer: Self-pay | Admitting: Obstetrics and Gynecology

## 2015-09-28 DIAGNOSIS — Z3A25 25 weeks gestation of pregnancy: Secondary | ICD-10-CM

## 2015-09-28 DIAGNOSIS — O0932 Supervision of pregnancy with insufficient antenatal care, second trimester: Secondary | ICD-10-CM | POA: Insufficient documentation

## 2015-10-11 ENCOUNTER — Encounter: Payer: 59 | Admitting: Certified Nurse Midwife

## 2015-10-23 ENCOUNTER — Ambulatory Visit (INDEPENDENT_AMBULATORY_CARE_PROVIDER_SITE_OTHER): Payer: 59 | Admitting: Obstetrics & Gynecology

## 2015-10-23 DIAGNOSIS — Z3402 Encounter for supervision of normal first pregnancy, second trimester: Secondary | ICD-10-CM

## 2015-10-23 NOTE — Progress Notes (Signed)
   PRENATAL VISIT NOTE  Subjective:  Kelly Willis is a 20 y.o. G1P0 at 8082w2d being seen today for ongoing prenatal care.  She is currently monitored for the following issues for this low-risk pregnancy and has Pilonidal abscess; Supervision of normal first pregnancy, antepartum; and Late prenatal care on her problem list.  Patient reports no complaints.  Contractions: Not present. Vag. Bleeding: None.  Movement: Present. Denies leaking of fluid.   The following portions of the patient's history were reviewed and updated as appropriate: allergies, current medications, past family history, past medical history, past social history, past surgical history and problem list. Problem list updated.  Objective:   Vitals:   10/23/15 1524  BP: 127/87  Pulse: (!) 135  Temp: 97.8 F (36.6 C)  Weight: (!) 307 lb (139.3 kg)    Fetal Status:     Movement: Present     General:  Alert, oriented and cooperative. Patient is in no acute distress.  Skin: Skin is warm and dry. No rash noted.   Cardiovascular: Normal heart rate noted  Respiratory: Normal respiratory effort, no problems with respiration noted  Abdomen: Soft, gravid, appropriate for gestational age. Pain/Pressure: Absent     Pelvic:  Cervical exam deferred        Extremities: Normal range of motion.     Mental Status: Normal mood and affect. Normal behavior. Normal judgment and thought content.   Urinalysis: Urine Protein: Trace Urine Glucose: Negative  Assessment and Plan:  Pregnancy: G1P0 at 7982w2d  1. Supervision of normal first pregnancy, antepartum, second trimester Needs 2 hr GTT  Preterm labor symptoms and general obstetric precautions including but not limited to vaginal bleeding, contractions, leaking of fluid and fetal movement were reviewed in detail with the patient. Please refer to After Visit Summary for other counseling recommendations.  Return in about 3 weeks (around 11/13/2015).  Adam PhenixJames G Suhayb Anzalone, MD

## 2015-10-23 NOTE — Patient Instructions (Signed)

## 2015-11-06 ENCOUNTER — Other Ambulatory Visit: Payer: 59

## 2015-11-06 DIAGNOSIS — Z3490 Encounter for supervision of normal pregnancy, unspecified, unspecified trimester: Secondary | ICD-10-CM

## 2015-11-07 LAB — GLUCOSE TOLERANCE, 2 HOURS W/ 1HR
GLUCOSE, FASTING: 108 mg/dL — AB (ref 65–91)
Glucose, 1 hour: 160 mg/dL (ref 65–179)
Glucose, 2 hour: 147 mg/dL (ref 65–152)

## 2015-11-07 LAB — HIV ANTIBODY (ROUTINE TESTING W REFLEX): HIV SCREEN 4TH GENERATION: NONREACTIVE

## 2015-11-07 LAB — CBC
HEMATOCRIT: 30.4 % — AB (ref 34.0–46.6)
Hemoglobin: 9.7 g/dL — ABNORMAL LOW (ref 11.1–15.9)
MCH: 26.4 pg — ABNORMAL LOW (ref 26.6–33.0)
MCHC: 31.9 g/dL (ref 31.5–35.7)
MCV: 83 fL (ref 79–97)
PLATELETS: 253 10*3/uL (ref 150–379)
RBC: 3.67 x10E6/uL — ABNORMAL LOW (ref 3.77–5.28)
RDW: 13.7 % (ref 12.3–15.4)
WBC: 9 10*3/uL (ref 3.4–10.8)

## 2015-11-07 LAB — RPR: RPR Ser Ql: NONREACTIVE

## 2015-11-08 ENCOUNTER — Other Ambulatory Visit: Payer: Self-pay | Admitting: Obstetrics

## 2015-11-14 ENCOUNTER — Encounter: Payer: Self-pay | Admitting: Obstetrics and Gynecology

## 2015-11-15 NOTE — Progress Notes (Signed)
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 Her supplies have been called to her pharmacy.

## 2015-11-21 ENCOUNTER — Ambulatory Visit (INDEPENDENT_AMBULATORY_CARE_PROVIDER_SITE_OTHER): Payer: 59 | Admitting: Certified Nurse Midwife

## 2015-11-21 VITALS — BP 137/89 | HR 121 | Temp 98.2°F | Wt 310.7 lb

## 2015-11-21 DIAGNOSIS — Z23 Encounter for immunization: Secondary | ICD-10-CM | POA: Diagnosis not present

## 2015-11-21 DIAGNOSIS — O24419 Gestational diabetes mellitus in pregnancy, unspecified control: Secondary | ICD-10-CM

## 2015-11-21 DIAGNOSIS — O24410 Gestational diabetes mellitus in pregnancy, diet controlled: Secondary | ICD-10-CM | POA: Diagnosis not present

## 2015-11-21 DIAGNOSIS — O0993 Supervision of high risk pregnancy, unspecified, third trimester: Secondary | ICD-10-CM

## 2015-11-21 MED ORDER — TETANUS-DIPHTH-ACELL PERTUSSIS 5-2.5-18.5 LF-MCG/0.5 IM SUSP
0.5000 mL | Freq: Once | INTRAMUSCULAR | Status: AC
  Administered 2015-11-21: 0.5 mL via INTRAMUSCULAR

## 2015-11-21 NOTE — Addendum Note (Signed)
Addended by: Francene FindersJAMES, QUINETTA C on: 11/21/2015 01:20 PM   Modules accepted: Orders

## 2015-11-21 NOTE — Patient Instructions (Signed)
Diabetes Mellitus and Food It is important for you to manage your blood sugar (glucose) level. Your blood glucose level can be greatly affected by what you eat. Eating healthier foods in the appropriate amounts throughout the day at about the same time each day will help you control your blood glucose level. It can also help slow or prevent worsening of your diabetes mellitus. Healthy eating may even help you improve the level of your blood pressure and reach or maintain a healthy weight.  General recommendations for healthful eating and cooking habits include:  Eating meals and snacks regularly. Avoid going long periods of time without eating to lose weight.  Eating a diet that consists mainly of plant-based foods, such as fruits, vegetables, nuts, legumes, and whole grains.  Using low-heat cooking methods, such as baking, instead of high-heat cooking methods, such as deep frying. Work with your dietitian to make sure you understand how to use the Nutrition Facts information on food labels. HOW CAN FOOD AFFECT ME? Carbohydrates Carbohydrates affect your blood glucose level more than any other type of food. Your dietitian will help you determine how many carbohydrates to eat at each meal and teach you how to count carbohydrates. Counting carbohydrates is important to keep your blood glucose at a healthy level, especially if you are using insulin or taking certain medicines for diabetes mellitus. Alcohol Alcohol can cause sudden decreases in blood glucose (hypoglycemia), especially if you use insulin or take certain medicines for diabetes mellitus. Hypoglycemia can be a life-threatening condition. Symptoms of hypoglycemia (sleepiness, dizziness, and disorientation) are similar to symptoms of having too much alcohol.  If your health care provider has given you approval to drink alcohol, do so in moderation and use the following guidelines:  Women should not have more than one drink per day, and men  should not have more than two drinks per day. One drink is equal to:  12 oz of beer.  5 oz of wine.  1 oz of hard liquor.  Do not drink on an empty stomach.  Keep yourself hydrated. Have water, diet soda, or unsweetened iced tea.  Regular soda, juice, and other mixers might contain a lot of carbohydrates and should be counted. WHAT FOODS ARE NOT RECOMMENDED? As you make food choices, it is important to remember that all foods are not the same. Some foods have fewer nutrients per serving than other foods, even though they might have the same number of calories or carbohydrates. It is difficult to get your body what it needs when you eat foods with fewer nutrients. Examples of foods that you should avoid that are high in calories and carbohydrates but low in nutrients include:  Trans fats (most processed foods list trans fats on the Nutrition Facts label).  Regular soda.  Juice.  Candy.  Sweets, such as cake, pie, doughnuts, and cookies.  Fried foods. WHAT FOODS CAN I EAT? Eat nutrient-rich foods, which will nourish your body and keep you healthy. The food you should eat also will depend on several factors, including:  The calories you need.  The medicines you take.  Your weight.  Your blood glucose level.  Your blood pressure level.  Your cholesterol level. You should eat a variety of foods, including:  Protein.  Lean cuts of meat.  Proteins low in saturated fats, such as fish, egg whites, and beans. Avoid processed meats.  Fruits and vegetables.  Fruits and vegetables that may help control blood glucose levels, such as apples, mangoes, and   yams.  Dairy products.  Choose fat-free or low-fat dairy products, such as milk, yogurt, and cheese.  Grains, bread, pasta, and rice.  Choose whole grain products, such as multigrain bread, whole oats, and brown rice. These foods may help control blood pressure.  Fats.  Foods containing healthful fats, such as nuts,  avocado, olive oil, canola oil, and fish. DOES EVERYONE WITH DIABETES MELLITUS HAVE THE SAME MEAL PLAN? Because every person with diabetes mellitus is different, there is not one meal plan that works for everyone. It is very important that you meet with a dietitian who will help you create a meal plan that is just right for you.   This information is not intended to replace advice given to you by your health care provider. Make sure you discuss any questions you have with your health care provider.   Document Released: 10/25/2004 Document Revised: 02/18/2014 Document Reviewed: 12/25/2012 Elsevier Interactive Patient Education 2016 [employer]. Gestational Diabetes Mellitus Gestational diabetes mellitus, often simply referred to as gestational diabetes, is a type of diabetes that some women develop during pregnancy. In gestational diabetes, the pancreas does not make enough insulin (a hormone), the cells are less responsive to the insulin that is made (insulin resistance), or both.Normally, insulin moves sugars from food into the tissue cells. The tissue cells use the sugars for energy. The lack of insulin or the lack of normal response to insulin causes excess sugars to build up in the blood instead of going into the tissue cells. As a result, high blood sugar (hyperglycemia) develops. The effect of high sugar (glucose) levels can cause many problems.  RISK FACTORS You have an increased chance of developing gestational diabetes if you have a family history of diabetes and also have one or more of the following risk factors:  A body mass index over 30 (obesity).  A previous pregnancy with gestational diabetes.  An older age at the time of pregnancy. If blood glucose levels are kept in the normal range during pregnancy, women can have a healthy pregnancy. If your blood glucose levels are not well controlled, there may be risks to you, your unborn baby (fetus), your labor and delivery, or your  newborn baby.  SYMPTOMS  If symptoms are experienced, they are much like symptoms you would normally expect during pregnancy. The symptoms of gestational diabetes include:   Increased thirst (polydipsia).  Increased urination (polyuria).  Increased urination during the night (nocturia).  Weight loss. This weight loss may be rapid.  Frequent, recurring infections.  Tiredness (fatigue).  Weakness.  Vision changes, such as blurred vision.  Fruity smell to your breath.  Abdominal pain. DIAGNOSIS Diabetes is diagnosed when blood glucose levels are increased. Your blood glucose level may be checked by one or more of the following blood tests:  A fasting blood glucose test. You will not be allowed to eat for at least 8 hours before a blood sample is taken.  A random blood glucose test. Your blood glucose is checked at any time of the day regardless of when you ate.  An oral glucose tolerance test (OGTT). Your blood glucose is measured after you have not eaten (fasted) for 1-3 hours and then after you drink a glucose-containing beverage. Since the hormones that cause insulin resistance are highest at about 24-28 weeks of a pregnancy, an OGTT is usually performed during that time. If you have risk factors, you may be screened for undiagnosed type 2 diabetes at your first prenatal visit. TREATMENT  Gestational  diabetes should be managed first with diet and exercise. Medicines may be added only if they are needed.  You will need to take diabetes medicine or insulin daily to keep blood glucose levels in the desired range.  You will need to match insulin dosing with exercise and healthy food choices. If you have gestational diabetes, your treatment goal is to maintain the following blood glucose levels:  Before meals (preprandial): at or below 95 mg/dL.  After meals (postprandial):  One hour after a meal: at or below 140 mg/dL.  Two hours after a meal: at or below 120 mg/dL. If you  have pre-existing type 1 or type 2 diabetes, your treatment goal is to maintain the following blood glucose levels:  Before meals, at bedtime, and overnight: 60-99 mg/dL.  After meals: peak of 100-129 mg/dL. HOME CARE INSTRUCTIONS   Have your hemoglobin A1c level checked twice a year.  Perform daily blood glucose monitoring as directed by your health care provider. It is common to perform frequent blood glucose monitoring.  Monitor urine ketones when you are ill and as directed by your health care provider.  Take your diabetes medicine and insulin as directed by your health care provider to maintain your blood glucose level in the desired range.  Never run out of diabetes medicine or insulin. It is needed every day.  Adjust insulin based on your intake of carbohydrates. Carbohydrates can raise blood glucose levels but need to be included in your diet. Carbohydrates provide vitamins, minerals, and fiber which are an essential part of a healthy diet. Carbohydrates are found in fruits, vegetables, whole grains, dairy products, legumes, and foods containing added sugars.  Eat healthy foods. Alternate 3 meals with 3 snacks.  Maintain a healthy weight gain. The usual total expected weight gain varies according to your prepregnancy body mass index (BMI).  Carry a medical alert card or wear your medical alert jewelry.  Carry a 15-gram carbohydrate snack with you at all times to treat low blood glucose (hypoglycemia). Some examples of 15-gram carbohydrate snacks include:  Glucose tablets, 3 or 4.  Glucose gel, 15-gram tube.  Raisins, 2 tablespoons (24 g).  Jelly beans, 6.  Animal crackers, 8.  Fruit juice, regular soda, or low-fat milk, 4 ounces (120 mL).  Gummy treats, 9.  Recognize hypoglycemia. Hypoglycemia during pregnancy occurs with blood glucose levels of 60 mg/dL and below. The risk for hypoglycemia increases when fasting or skipping meals, during or after intense exercise,  and during sleep. Hypoglycemia symptoms can include:  Tremors or shakes.  Decreased ability to concentrate.  Sweating.  Increased heart rate.  Headache.  Dry mouth.  Hunger.  Irritability.  Anxiety.  Restless sleep.  Altered speech or coordination.  Confusion.  Treat hypoglycemia promptly. If you are alert and able to safely swallow, follow the 15:15 rule:  Take 15-20 grams of rapid-acting glucose or carbohydrate. Rapid-acting options include glucose gel, glucose tablets, or 4 ounces (120 mL) of fruit juice, regular soda, or low-fat milk.  Check your blood glucose level 15 minutes after taking the glucose.  Take 15-20 grams more of glucose if the repeat blood glucose level is still 70 mg/dL or below.  Eat a meal or snack within 1 hour once blood glucose levels return to normal.  Be alert to polyuria (excess urination) and polydipsia (excess thirst) which are early signs of hyperglycemia. An early awareness of hyperglycemia allows for prompt treatment. Treat hyperglycemia as directed by your health care provider.  Engage in at  least 30 minutes of physical activity a day or as directed by your health care provider. Ten minutes of physical activity timed 30 minutes after each meal is encouraged to control postprandial blood glucose levels.  Adjust your insulin dosing and food intake as needed if you start a new exercise or sport.  Follow your sick-day plan at any time you are unable to eat or drink as usual.  Avoid tobacco and alcohol use.  Keep all follow-up visits as directed by your health care provider.  Follow the advice of your health care provider regarding your prenatal and post-delivery (postpartum) appointments, meal planning, exercise, medicines, vitamins, blood tests, other medical tests, and physical activities.  Perform daily skin and foot care. Examine your skin and feet daily for cuts, bruises, redness, nail problems, bleeding, blisters, or  sores.  Brush your teeth and gums at least twice a day and floss at least once a day. Follow up with your dentist regularly.  Schedule an eye exam during the first trimester of your pregnancy or as directed by your health care provider.  Share your diabetes management plan with your workplace or school.  Stay up-to-date with immunizations.  Learn to manage stress.  Obtain ongoing diabetes education and support as needed.  Learn about and consider breastfeeding your baby.  You should have your blood sugar level checked 6-12 weeks after delivery. This is done with an oral glucose tolerance test (OGTT). SEEK MEDICAL CARE IF:   You are unable to eat food or drink fluids for more than 6 hours.  You have nausea and vomiting for more than 6 hours.  You have a blood glucose level of 200 mg/dL and you have ketones in your urine.  There is a change in mental status.  You develop vision problems.  You have a persistent headache.  You have upper abdominal pain or discomfort.  You develop an additional serious illness.  You have diarrhea for more than 6 hours.  You have been sick or have had a fever for a couple of days and are not getting better. SEEK IMMEDIATE MEDICAL CARE IF:   You have difficulty breathing.  You no longer feel the baby moving.  You are bleeding or have discharge from your vagina.  You start having premature contractions or labor. MAKE SURE YOU:  Understand these instructions.  Will watch your condition.  Will get help right away if you are not doing well or get worse.   This information is not intended to replace advice given to you by your health care provider. Make sure you discuss any questions you have with your health care provider.   Document Released: 05/06/2000 Document Revised: 02/18/2014 Document Reviewed: 08/27/2011 Elsevier Interactive Patient Education 2016 Elsevier Inc. Blood Glucose Monitoring, Adult Monitoring your blood glucose  (also know as blood sugar) helps you to manage your diabetes. It also helps you and your health care provider monitor your diabetes and determine how well your treatment plan is working. WHY SHOULD YOU MONITOR YOUR BLOOD GLUCOSE?  It can help you understand how food, exercise, and medicine affect your blood glucose.  It allows you to know what your blood glucose is at any given moment. You can quickly tell if you are having low blood glucose (hypoglycemia) or high blood glucose (hyperglycemia).  It can help you and your health care provider know how to adjust your medicines.  It can help you understand how to manage an illness or adjust medicine for exercise. WHEN SHOULD YOU  TEST? Your health care provider will help you decide how often you should check your blood glucose. This may depend on the type of diabetes you have, your diabetes control, or the types of medicines you are taking. Be sure to write down all of your blood glucose readings so that this information can be reviewed with your health care provider. See below for examples of testing times that your health care provider may suggest. Type 1 Diabetes  Test at least 2 times per day if your diabetes is well controlled, if you are using an insulin pump, or if you perform multiple daily injections.  If your diabetes is not well controlled or if you are sick, you may need to test more often.  It is a good idea to also test:  Before every insulin injection.  Before and after exercise.  Between meals and 2 hours after a meal.  Occasionally between 2:00 a.m. and 3:00 a.m. Type 2 Diabetes  If you are taking insulin, test at least 2 times per day. However, it is best to test before every insulin injection.  If you take medicines by mouth (orally), test 2 times a day.  If you are on a controlled diet, test once a day.  If your diabetes is not well controlled or if you are sick, you may need to monitor more often. HOW TO MONITOR  YOUR BLOOD GLUCOSE Supplies Needed  Blood glucose meter.  Test strips for your meter. Each meter has its own strips. You must use the strips that go with your own meter.  A pricking needle (lancet).  A device that holds the lancet (lancing device).  A journal or log book to write down your results. Procedure  Wash your hands with soap and water. Alcohol is not preferred.  Prick the side of your finger (not the tip) with the lancet.  Gently milk the finger until a small drop of blood appears.  Follow the instructions that come with your meter for inserting the test strip, applying blood to the strip, and using your blood glucose meter. Other Areas to Get Blood for Testing Some meters allow you to use other areas of your body (other than your finger) to test your blood. These areas are called alternative sites. The most common alternative sites are:  The forearm.  The thigh.  The back area of the lower leg.  The palm of the hand. The blood flow in these areas is slower. Therefore, the blood glucose values you get may be delayed, and the numbers are different from what you would get from your fingers. Do not use alternative sites if you think you are having hypoglycemia. Your reading will not be accurate. Always use a finger if you are having hypoglycemia. Also, if you cannot feel your lows (hypoglycemia unawareness), always use your fingers for your blood glucose checks. ADDITIONAL TIPS FOR GLUCOSE MONITORING  Do not reuse lancets.  Always carry your supplies with you.  All blood glucose meters have a 24-hour "hotline" number to call if you have questions or need help.  Adjust (calibrate) your blood glucose meter with a control solution after finishing a few boxes of strips. BLOOD GLUCOSE RECORD KEEPING It is a good idea to keep a daily record or log of your blood glucose readings. Most glucose meters, if not all, keep your glucose records stored in the meter. Some meters come  with the ability to download your records to your home computer. Keeping a record of your  blood glucose readings is especially helpful if you are wanting to look for patterns. Make notes to go along with the blood glucose readings because you might forget what happened at that exact time. Keeping good records helps you and your health care provider to work together to achieve good diabetes management.    This information is not intended to replace advice given to you by your health care provider. Make sure you discuss any questions you have with your health care provider.   Document Released: 01/31/2003 Document Revised: 02/18/2014 Document Reviewed: 06/22/2012 Elsevier Interactive Patient Education [employer]2016 [employer] Inc[employer]

## 2015-11-21 NOTE — Progress Notes (Signed)
Subjective:    Kelly Willis is a 20 y.o. female being seen today for her obstetrical visit. She is at 3684w3d gestation. Patient reports no complaints. Fetal movement: normal.  Discussed how to log blood sugars, logs given to patient.  MFM orders completed and nutrition referrals done.    Problem List Items Addressed This Visit      Endocrine   GDM (gestational diabetes mellitus) - Primary   Relevant Orders   AMB referral to maternal fetal medicine   Amb Referral to Nutrition and Diabetic E   US MFM OB FOLLOW UP     Other   Supervision of normal first pregnancy, antepartum    Other Visit Diagnoses    Encounter for immunization       Relevant Orders   Flu Vaccine QUAD 36+ mos IM (Completed)     Patient Active Problem List   Diagnosis Date Noted  . GDM (gestational diabetes mellitus) 11/21/2015  . Supervision of normal first pregnancy, antepartum 08/23/2015  . Late prenatal care 08/23/2015  . Pilonidal abscess 10/16/2012   Objective:    BP 137/89   Pulse (!) 121   Temp 98.2 F (36.8 C)   Wt (!) 310 lb 11.2 oz (140.9 kg)   LMP 04/15/2015   BMI 50.15 kg/m  FHT:  152 BPM  Uterine Size: 33 cm and size greater than dates  Presentation: cephalic     Assessment:    Pregnancy @ 5984w3d weeks  GDM: new diagnosis  Flu and TDaP vacienes given  Plan:     labs reviewed, problem list updated Consent signed. GBS planning TDAP offered & given Rhogam given for RH negative Pediatrician: discussed. Infant feeding: plans to breastfeed. Maternity leave: discussed. Cigarette smoking: quit around 12 weeks of pregnancy. Orders Placed This Encounter  Procedures  . US MFM OB FOLLOW UP    Standing Status:   Future    Standing Expiration Date:   01/20/2017    Order Specific Question:   Reason for Exam (SYMPTOM  OR DIAGNOSIS REQUIRED)    Answer:   GDM    Order Specific Question:   Preferred imaging location?    Answer:   [facility]  . Flu Vaccine QUAD 36+ mos IM  . AMB  referral to maternal fetal medicine    Referral Priority:   Routine    Referral Type:   Consultation    Referral Reason:   Specialty Services Required    Number of Visits Requested:   1  . Amb Referral to Nutrition and Diabetic E    Referral Priority:   Routine    Referral Type:   Consultation    Referral Reason:   Specialty Services Required    Number of Visits Requested:   1   No orders of the defined types were placed in this encounter.  Follow up in 2 Weeks.

## 2015-12-05 ENCOUNTER — Institutional Professional Consult (permissible substitution): Payer: 59 | Admitting: Pediatrics

## 2015-12-07 ENCOUNTER — Other Ambulatory Visit: Payer: Self-pay | Admitting: Certified Nurse Midwife

## 2015-12-07 ENCOUNTER — Encounter (HOSPITAL_COMMUNITY): Payer: Self-pay

## 2015-12-07 ENCOUNTER — Ambulatory Visit (HOSPITAL_COMMUNITY)
Admission: RE | Admit: 2015-12-07 | Discharge: 2015-12-07 | Disposition: A | Discharge status: Home / Self Care | Payer: 59 | Source: Ambulatory Visit | Attending: Certified Nurse Midwife | Admitting: Certified Nurse Midwife

## 2015-12-07 ENCOUNTER — Encounter: Payer: 59 | Attending: Certified Nurse Midwife | Admitting: *Deleted

## 2015-12-07 DIAGNOSIS — Z3A33 33 weeks gestation of pregnancy: Secondary | ICD-10-CM | POA: Diagnosis not present

## 2015-12-07 DIAGNOSIS — O0933 Supervision of pregnancy with insufficient antenatal care, third trimester: Secondary | ICD-10-CM | POA: Diagnosis not present

## 2015-12-07 DIAGNOSIS — O24410 Gestational diabetes mellitus in pregnancy, diet controlled: Secondary | ICD-10-CM | POA: Diagnosis not present

## 2015-12-07 DIAGNOSIS — O283 Abnormal ultrasonic finding on antenatal screening of mother: Secondary | ICD-10-CM | POA: Diagnosis present

## 2015-12-07 DIAGNOSIS — Z362 Encounter for other antenatal screening follow-up: Secondary | ICD-10-CM | POA: Diagnosis not present

## 2015-12-07 DIAGNOSIS — Z713 Dietary counseling and surveillance: Secondary | ICD-10-CM | POA: Insufficient documentation

## 2015-12-07 DIAGNOSIS — O99213 Obesity complicating pregnancy, third trimester: Secondary | ICD-10-CM | POA: Insufficient documentation

## 2015-12-07 DIAGNOSIS — O24419 Gestational diabetes mellitus in pregnancy, unspecified control: Secondary | ICD-10-CM

## 2015-12-07 NOTE — Progress Notes (Signed)
  Patient was seen on 12/07/2015 for Gestational [facility]management [facility] She is here with her friend as well as the baby's father. The following learning objectives were met by the patient during this course:   States the definition of Gestational Diabetes  States why dietary management is important in controlling blood glucose  Describes the effects each nutrient has on blood glucose levels  Demonstrates ability to create a balanced meal plan  Demonstrates carbohydrate counting   States when to check blood glucose levels  Demonstrates proper blood glucose monitoring techniques  States the effect of stress and exercise on blood glucose levels  States the importance of limiting caffeine and abstaining from alcohol and smoking  Blood glucose monitor given to patient prior to visit, she brought BG Logs for the past 2 weeks. Fasting BG too high ranging between 88-203 mg/dl. She states she is testing 2 ours after meals with reported range between 100-150 post prandial  Patient instructed to monitor glucose levels: FBS: 60 - <90 2 hour: <120  *Patient received handouts:  Nutrition Diabetes and Pregnancy  Carbohydrate Counting List  Patient will be seen for follow-up as needed.

## 2015-12-11 ENCOUNTER — Other Ambulatory Visit: Payer: Self-pay | Admitting: Certified Nurse Midwife

## 2015-12-11 ENCOUNTER — Other Ambulatory Visit (HOSPITAL_COMMUNITY): Payer: Self-pay | Admitting: Maternal and Fetal Medicine

## 2015-12-11 DIAGNOSIS — O99213 Obesity complicating pregnancy, third trimester: Secondary | ICD-10-CM

## 2015-12-11 DIAGNOSIS — O0933 Supervision of pregnancy with insufficient antenatal care, third trimester: Secondary | ICD-10-CM

## 2015-12-11 DIAGNOSIS — Z3400 Encounter for supervision of normal first pregnancy, unspecified trimester: Secondary | ICD-10-CM

## 2015-12-11 DIAGNOSIS — O24410 Gestational diabetes mellitus in pregnancy, diet controlled: Secondary | ICD-10-CM

## 2015-12-11 DIAGNOSIS — Z3A36 36 weeks gestation of pregnancy: Secondary | ICD-10-CM

## 2015-12-17 ENCOUNTER — Inpatient Hospital Stay (HOSPITAL_COMMUNITY)
Admission: AD | Admit: 2015-12-17 | Discharge: 2015-12-17 | Disposition: A | Discharge status: Home / Self Care | Payer: 59 | Source: Ambulatory Visit | Attending: Obstetrics and Gynecology | Admitting: Obstetrics and Gynecology

## 2015-12-17 ENCOUNTER — Encounter (HOSPITAL_COMMUNITY): Payer: Self-pay

## 2015-12-17 DIAGNOSIS — Z87891 Personal history of nicotine dependence: Secondary | ICD-10-CM | POA: Insufficient documentation

## 2015-12-17 DIAGNOSIS — O99213 Obesity complicating pregnancy, third trimester: Secondary | ICD-10-CM | POA: Diagnosis not present

## 2015-12-17 DIAGNOSIS — O99613 Diseases of the digestive system complicating pregnancy, third trimester: Secondary | ICD-10-CM

## 2015-12-17 DIAGNOSIS — Z8249 Family history of ischemic heart disease and other diseases of the circulatory system: Secondary | ICD-10-CM | POA: Insufficient documentation

## 2015-12-17 DIAGNOSIS — Z9889 Other specified postprocedural states: Secondary | ICD-10-CM | POA: Insufficient documentation

## 2015-12-17 DIAGNOSIS — K529 Noninfective gastroenteritis and colitis, unspecified: Secondary | ICD-10-CM | POA: Diagnosis not present

## 2015-12-17 DIAGNOSIS — Z3A35 35 weeks gestation of pregnancy: Secondary | ICD-10-CM | POA: Diagnosis not present

## 2015-12-17 DIAGNOSIS — O212 Late vomiting of pregnancy: Secondary | ICD-10-CM | POA: Insufficient documentation

## 2015-12-17 DIAGNOSIS — O99343 Other mental disorders complicating pregnancy, third trimester: Secondary | ICD-10-CM | POA: Insufficient documentation

## 2015-12-17 DIAGNOSIS — O26893 Other specified pregnancy related conditions, third trimester: Secondary | ICD-10-CM | POA: Diagnosis not present

## 2015-12-17 DIAGNOSIS — Z888 Allergy status to other drugs, medicaments and biological substances status: Secondary | ICD-10-CM | POA: Insufficient documentation

## 2015-12-17 LAB — COMPREHENSIVE METABOLIC PANEL
ALK PHOS: 145 U/L — AB (ref 38–126)
ALT: 24 U/L (ref 14–54)
ANION GAP: 9 (ref 5–15)
AST: 24 U/L (ref 15–41)
Albumin: 3.4 g/dL — ABNORMAL LOW (ref 3.5–5.0)
BILIRUBIN TOTAL: 0.6 mg/dL (ref 0.3–1.2)
BUN: 8 mg/dL (ref 6–20)
CALCIUM: 9.9 mg/dL (ref 8.9–10.3)
CO2: 21 mmol/L — AB (ref 22–32)
Chloride: 106 mmol/L (ref 101–111)
Creatinine, Ser: 0.63 mg/dL (ref 0.44–1.00)
Glucose, Bld: 104 mg/dL — ABNORMAL HIGH (ref 65–99)
Potassium: 3.8 mmol/L (ref 3.5–5.1)
SODIUM: 136 mmol/L (ref 135–145)
TOTAL PROTEIN: 7.5 g/dL (ref 6.5–8.1)

## 2015-12-17 LAB — URINALYSIS, ROUTINE W REFLEX MICROSCOPIC
BILIRUBIN URINE: NEGATIVE
Glucose, UA: NEGATIVE mg/dL
HGB URINE DIPSTICK: NEGATIVE
Ketones, ur: >80 mg/dL — AB
Leukocytes, UA: NEGATIVE
NITRITE: NEGATIVE
PH: 6 (ref 5.0–8.0)
Protein, ur: NEGATIVE mg/dL

## 2015-12-17 LAB — CBC WITH DIFFERENTIAL/PLATELET
Basophils Absolute: 0 10*3/uL (ref 0.0–0.1)
Basophils Relative: 0 %
EOS ABS: 0 10*3/uL (ref 0.0–0.7)
Eosinophils Relative: 0 %
HCT: 36.2 % (ref 36.0–46.0)
HEMOGLOBIN: 11.8 g/dL — AB (ref 12.0–15.0)
LYMPHS ABS: 1.3 10*3/uL (ref 0.7–4.0)
Lymphocytes Relative: 10 %
MCH: 26.6 pg (ref 26.0–34.0)
MCHC: 32.6 g/dL (ref 30.0–36.0)
MCV: 81.5 fL (ref 78.0–100.0)
MONOS PCT: 3 %
Monocytes Absolute: 0.4 10*3/uL (ref 0.1–1.0)
NEUTROS PCT: 87 %
Neutro Abs: 11.4 10*3/uL — ABNORMAL HIGH (ref 1.7–7.7)
Platelets: 298 10*3/uL (ref 150–400)
RBC: 4.44 MIL/uL (ref 3.87–5.11)
RDW: 13.9 % (ref 11.5–15.5)
WBC: 13 10*3/uL — ABNORMAL HIGH (ref 4.0–10.5)

## 2015-12-17 LAB — GLUCOSE, CAPILLARY: Glucose-Capillary: 96 mg/dL (ref 65–99)

## 2015-12-17 LAB — LIPASE, BLOOD: LIPASE: 20 U/L (ref 11–51)

## 2015-12-17 MED ORDER — PROMETHAZINE HCL 25 MG/ML IJ SOLN
12.5000 mg | Freq: Once | INTRAMUSCULAR | Status: AC
  Administered 2015-12-17: 12.5 mg via INTRAVENOUS
  Filled 2015-12-17: qty 1

## 2015-12-17 MED ORDER — PROMETHAZINE HCL 25 MG PO TABS: 25.0000 mg | ORAL_TABLET | Freq: Four times a day (QID) | ORAL | 1 refills | Status: DC | PRN

## 2015-12-17 MED ORDER — SODIUM CHLORIDE 0.9 % IV SOLN
INTRAVENOUS | Status: DC
  Administered 2015-12-17: 17:00:00 via INTRAVENOUS

## 2015-12-17 NOTE — Discharge Instructions (Signed)
Food Poisoning °Food poisoning is an illness caused by something you ate or drank. There are over 250 known causes of food poisoning. However, many other causes are unknown. You can be treated even if the exact cause of your food poisoning is not known. In most cases, food poisoning is mild and lasts 1 to 2 days. However, some cases can be serious, especially for people with low immune systems, the elderly, children and infants, and pregnant women. °CAUSES  °Poor personal hygiene, improper cleaning of storage and preparation areas, and unclean utensils can cause infection or tainting (contamination) of foods. The causes of food poisoning are numerous. Infectious agents, such as viruses, bacteria, or parasites, can cause harm by infecting the intestine and disrupting the absorption of nutrients and water. This can cause diarrhea and lead to dehydration. Viruses are responsible for most of the food poisonings in which an agent is found. Parasites are less likely to cause food poisoning. Toxic agents, such as poisonous mushrooms, marine algae, and pesticides can also cause food poisoning. °· Viral causes of food poisoning include: °¨ Norovirus. °¨ Rotavirus. °¨ Hepatitis A. °· Bacterial causes of food poisoning include: °¨ Salmonellae. °¨ Campylobacter. °¨ Bacillus cereus. °¨ Escherichia coli (E. coli). °¨ Shigella. °¨ Listeria monocytogenes. °¨ Clostridium botulinum (botulism). °¨ Vibrio cholerae. °· Parasites that can cause food poisoning include: °¨ Giardia. °¨ Cryptosporidium. °¨ Toxoplasma. °SYMPTOMS °Symptoms may appear several hours or longer after consuming the contaminated food or drink. Symptoms may include: °· Nausea. °· Vomiting. °· Cramping. °· Diarrhea. °· Fever and chills. °· Muscle aches. °DIAGNOSIS °Your health care provider may be able to diagnose food poisoning from a list of what you have recently eaten and results from lab tests. Diagnostic tests may include an exam of the feces. °TREATMENT °In  most cases, treatment focuses on helping to relieve your symptoms and staying well hydrated. Antibiotic medicines are rarely needed. In severe cases, hospitalization may be required. °HOME CARE INSTRUCTIONS  °· Drink enough water and fluids to keep your urine clear or pale yellow. Drink small amounts of fluids frequently and increase as tolerated. °· Ask your health care provider for specific rehydration instructions. °· Avoid: °¨ Foods high in sugar. °¨ Alcohol. °¨ Carbonated drinks. °¨ Tobacco. °¨ Juice. °¨ Caffeine drinks. °¨ Extremely hot or cold fluids. °¨ Fatty, greasy foods. °¨ Too much intake of anything at one time. °¨ Dairy products until 24 to 48 hours after diarrhea stops. °· You may consume probiotics. Probiotics are active cultures of beneficial bacteria. They may lessen the amount and number of diarrheal stools in adults. Probiotics can be found in yogurt with active cultures and in supplements. °· Wash your hands well to avoid spreading the bacteria. °· Take medicines only as directed by your health care provider. Do not give your child aspirin because of the association with Reye's syndrome. °· Ask your health care provider if you should continue to take your regular prescribed and over-the-counter medicines. °PREVENTION  °· Wash your hands, food preparation surfaces, and utensils thoroughly before and after handling raw foods. °· Keep refrigerated foods below 40°F (5°C). °· Serve hot foods immediately or keep them heated above 140°F (60°C). °· Divide large volumes of food into small portions for rapid cooling in the refrigerator. Hot, bulky foods in the refrigerator can raise the temperature of other foods that have already cooled. °· Follow approved canning procedures. °· Heat canned foods thoroughly before tasting. °· When in doubt, throw it out. °· Infants, the elderly, women   who are pregnant, and people with compromised immune systems are especially susceptible to food poisoning. These people  should never consume unpasteurized cheese, unpasteurized cider, raw fish, raw seafood, or raw meat-type products. °SEEK IMMEDIATE MEDICAL CARE IF:  °· You have difficulty breathing, swallowing, talking, or moving. °· You develop blurred vision. °· You are unable to keep fluids down. °· You faint or nearly faint. °· Your eyes turn yellow. °· Vomiting or diarrhea develops or becomes persistent. °· Abdominal pain develops, increases, or localizes in one small area. °· You have a fever. °· The diarrhea becomes excessive or contains blood or mucus. °· You develop excessive weakness, dizziness, or extreme thirst. °· You have no urine for 8 hours. °MAKE SURE YOU:  °· Understand these instructions. °· Will watch your condition. °· Will get help right away if you are not doing well or get worse. °  °This information is not intended to replace advice given to you by your health care provider. Make sure you discuss any questions you have with your health care provider. °  °Document Released: 10/27/2003 Document Revised: 02/18/2014 Document Reviewed: 08/01/2014 °Elsevier Interactive Patient Education ©2016 [employer] ° °

## 2015-12-17 NOTE — MAU Provider Note (Signed)
Patient Kelly Willis is a G1P0 here with complaints of abdominal pain and vomiting. The vomiting started today and the abdominal pain started last night. Hasn't been able to keep anything down today. Last night she had half a burger and potatos and lemonade. During the day yesterday she had water and chicken. She did not eat breakfast. Her last bowel movement was this morning; it was diarrhea.   Believes this to be food poisoning from restaurant last night.  Her mother also has symptoms.   History     CSN: [id number]  Arrival date and time: 12/17/15 1530   First Provider Initiated Contact with Patient 12/17/15 1631      Chief Complaint  Patient presents with  . Contractions  . Emesis   Emesis   This is a new problem. The current episode started today. The problem occurs 2 to 4 times per day. The problem has been gradually improving. The emesis has an appearance of bile. There has been no fever. Associated symptoms include abdominal pain. She has tried nothing for the symptoms.  Abdominal Pain  The current episode started 1 to 4 weeks ago. The onset quality is gradual. The problem has been gradually worsening. The pain is located in the periumbilical region and suprapubic region. The pain is at a severity of 10/10. The pain is severe. The quality of the pain is sharp. The abdominal pain does not radiate. Associated symptoms include vomiting. The pain is aggravated by eating and certain positions. The pain is relieved by nothing. There is no history of gallstones, GERD, pancreatitis or PUD.     Past Medical History:  Diagnosis Date  . ADHD (attention deficit hyperactivity disorder)   . Obesity   . Pilonidal cyst 06/2014    Past Surgical History:  Procedure Laterality Date  . PILONIDAL CYST EXCISION    . PILONIDAL CYST EXCISION N/A 07/08/2014   Procedure: CYST EXCISION PILONIDAL ;  Surgeon: [name], MD;  Location: [facility];  Service: General;  Laterality:  N/A;    Family History  Problem Relation Age of Onset  . Hypertension Maternal Grandfather     Social History  Substance Use Topics  . Smoking status: Former Smoker    Years: 2.00    Types: Cigarettes    Quit date: 08/16/2015  . Smokeless tobacco: Never Used     Comment: 4 cig./day  . Alcohol use Yes     Comment: occasionally    Allergies:  Allergies  Allergen Reactions  . Pepto-Bismol [Bismuth Subsalicylate] Nausea And Vomiting    No prescriptions prior to admission.    Review of Systems  Constitutional: Negative.   HENT: Negative.   Eyes: Negative.   Respiratory: Negative.   Cardiovascular: Negative.   Gastrointestinal: Positive for abdominal pain and vomiting.  Genitourinary: Negative.   Neurological: Negative.   Endo/Heme/Allergies: Negative.   Psychiatric/Behavioral: Negative.    Physical Exam   Blood pressure 145/80, pulse 120, temperature 98.1 F (36.7 C), temperature source Oral, resp. rate 18, height 5\' 6"  (1.676 m), weight (!) 311 lb 13.9 oz (141.5 kg), last menstrual period 04/15/2015, SpO2 99 %, unknown if currently breastfeeding.  Physical Exam  Constitutional: She is oriented to person, place, and time. She appears well-developed.  Neck: Normal range of motion.  Respiratory: Effort normal.  GI: Soft. Bowel sounds are normal.  Genitourinary: Vagina normal.  Musculoskeletal: Normal range of motion.  Neurological: She is alert and oriented to person, place, and time.  Skin: Skin is  warm and dry.    MAU Course  Procedures  MDM IV fluids  Assessment and Plan    Kelly Willis 12/17/2015, 4:38 PM   Assumed care of patient:  Abdomen soft and nontender No guarding or rebound.  Dilation: 1 Effacement (%): Thick Station:  (high) Presentation: Undeterminable Exam by:: m Tyreshia Ingman,cnm  FHR reactive, category I UCs every 3-4 min, mild to moderate  Maternal heart rate is elevated in 120s. I believe this reflects  dehydration. Will order labs, CBC, CMET, Lipase  Results for orders placed or performed during the hospital encounter of 12/17/15 (from the past 24 hour(s))  Urinalysis, Routine w reflex microscopic (not at [facility])     Status: Abnormal   Collection Time: 12/17/15  3:52 PM  Result Value Ref Range   Color, Urine YELLOW YELLOW   APPearance CLEAR CLEAR   Specific Gravity, Urine >1.030 (H) 1.005 - 1.030   pH 6.0 5.0 - 8.0   Glucose, UA NEGATIVE NEGATIVE mg/dL   Hgb urine dipstick NEGATIVE NEGATIVE   Bilirubin Urine NEGATIVE NEGATIVE   Ketones, ur >80 (A) NEGATIVE mg/dL   Protein, ur NEGATIVE NEGATIVE mg/dL   Nitrite NEGATIVE NEGATIVE   Leukocytes, UA NEGATIVE NEGATIVE  CBC with Differential/Platelet     Status: Abnormal   Collection Time: 12/17/15  5:05 PM  Result Value Ref Range   WBC 13.0 (H) 4.0 - 10.5 K/uL   RBC 4.44 3.87 - 5.11 MIL/uL   Hemoglobin 11.8 (L) 12.0 - 15.0 g/dL   HCT 28.436.2 13.236.0 - 44.046.0 %   MCV 81.5 78.0 - 100.0 fL   MCH 26.6 26.0 - 34.0 pg   MCHC 32.6 30.0 - 36.0 g/dL   RDW 10.213.9 72.511.5 - 36.615.5 %   Platelets 298 150 - 400 K/uL   Neutrophils Relative % 87 %   Neutro Abs 11.4 (H) 1.7 - 7.7 K/uL   Lymphocytes Relative 10 %   Lymphs Abs 1.3 0.7 - 4.0 K/uL   Monocytes Relative 3 %   Monocytes Absolute 0.4 0.1 - 1.0 K/uL   Eosinophils Relative 0 %   Eosinophils Absolute 0.0 0.0 - 0.7 K/uL   Basophils Relative 0 %   Basophils Absolute 0.0 0.0 - 0.1 K/uL  Comprehensive metabolic panel     Status: Abnormal   Collection Time: 12/17/15  5:05 PM  Result Value Ref Range   Sodium 136 135 - 145 mmol/L   Potassium 3.8 3.5 - 5.1 mmol/L   Chloride 106 101 - 111 mmol/L   CO2 21 (L) 22 - 32 mmol/L   Glucose, Bld 104 (H) 65 - 99 mg/dL   BUN 8 6 - 20 mg/dL   Creatinine, Ser 4.400.63 0.44 - 1.00 mg/dL   Calcium 9.9 8.9 - 34.710.3 mg/dL   Total Protein 7.5 6.5 - 8.1 g/dL   Albumin 3.4 (L) 3.5 - 5.0 g/dL   AST 24 15 - 41 U/L   ALT 24 14 - 54 U/L   Alkaline Phosphatase 145 (H) 38 - 126 U/L    Total Bilirubin 0.6 0.3 - 1.2 mg/dL   GFR calc non Af Amer >60 >60 mL/min   GFR calc Af Amer >60 >60 mL/min   Anion gap 9 5 - 15  Lipase, blood     Status: None   Collection Time: 12/17/15  5:05 PM  Result Value Ref Range   Lipase 20 11 - 51 U/L  Glucose, capillary     Status: None   Collection Time: 12/17/15  5:05 PM  Result Value Ref Range   Glucose-Capillary 96 65 - 99 mg/dL    Vertex confirmed by bedside US  Will give NS bolus Will give Phenergan for nausea  Feels better after a liter of fluid and Phenergan Wants to go home Tolerating crackers and juice  Discharge home Rx Phenergan for PRN nausea use Follow up in office next week Encouraged to return here or to other Urgent Care/ED if she develops worsening of symptoms, increase in pain, fever, or other concerning symptoms.

## 2015-12-17 NOTE — MAU Note (Signed)
Pt states she has been having contractions since last night and started vomiting this morning, about 4 times today.  Denies LOF/VB. Two episodes of diarrhea today.

## 2015-12-28 ENCOUNTER — Ambulatory Visit (HOSPITAL_COMMUNITY): Payer: 59

## 2016-01-05 ENCOUNTER — Ambulatory Visit (HOSPITAL_COMMUNITY)
Admission: RE | Admit: 2016-01-05 | Discharge: 2016-01-05 | Disposition: A | Discharge status: Home / Self Care | Payer: 59 | Source: Ambulatory Visit | Attending: Certified Nurse Midwife | Admitting: Certified Nurse Midwife

## 2016-01-05 ENCOUNTER — Encounter (HOSPITAL_COMMUNITY): Payer: Self-pay

## 2016-01-16 ENCOUNTER — Inpatient Hospital Stay (HOSPITAL_COMMUNITY)
Admission: AD | Admit: 2016-01-16 | Discharge: 2016-01-16 | Disposition: A | Discharge status: Home / Self Care | Payer: 59 | Source: Ambulatory Visit | Attending: Obstetrics & Gynecology | Admitting: Obstetrics & Gynecology

## 2016-01-16 ENCOUNTER — Telehealth: Payer: Self-pay

## 2016-01-16 ENCOUNTER — Encounter (HOSPITAL_COMMUNITY): Payer: Self-pay | Admitting: Certified Nurse Midwife

## 2016-01-16 HISTORY — DX: Gestational diabetes mellitus in pregnancy, unspecified control: O24.419

## 2016-01-16 LAB — WET PREP, GENITAL
CLUE CELLS WET PREP: NONE SEEN
Sperm: NONE SEEN
TRICH WET PREP: NONE SEEN
Yeast Wet Prep HPF POC: NONE SEEN

## 2016-01-16 LAB — OB RESULTS CONSOLE GBS: STREP GROUP B AG: POSITIVE

## 2016-01-16 NOTE — Telephone Encounter (Signed)
Returned Patient's call and pt stated that she was having occasional back pain. Asked patient could she time the pain, and she stated that it come and goes, and that it is relieved when she lays down, and reports good fetal movement.Marland Kitchen.Marland Kitchen.Marland Kitchen.Advised patient that if the pain gets closer together to go to hospital.

## 2016-01-16 NOTE — MAU Note (Addendum)
Pt states she has been having irregular ctxs since this AM. Pt has not been to her provider for over 3 weeks due to "not having a ride". Pt states she had GDM with diet control and she last took her BS at 1200 and it was "around 100 something". Pt denies LOF or vaginal bleeding. Fetus is active.

## 2016-01-16 NOTE — Progress Notes (Addendum)
Patient presented for RN labor exam. NST reactive, cervix unchanged, not in labor. However, a1dm with scant prenatal care. I reviewed with patient her fastings and 2 hour PPs; she says all her fastings are less than 95 and the majority of 2 hours are less than 120. Will draw gbs for culture and have scheduled iol for 12/9 when patient will be 40+0. Trich positive in July, pt says was treated. Will check wet prep today as toc.

## 2016-01-16 NOTE — MAU Note (Signed)
Pt to ambulate for 1 hour and return for SVE.

## 2016-01-17 ENCOUNTER — Encounter (HOSPITAL_COMMUNITY): Payer: Self-pay | Admitting: *Deleted

## 2016-01-17 ENCOUNTER — Telehealth (HOSPITAL_COMMUNITY): Payer: Self-pay | Admitting: *Deleted

## 2016-01-17 ENCOUNTER — Inpatient Hospital Stay (HOSPITAL_COMMUNITY)
Admission: AD | Admit: 2016-01-17 | Discharge: 2016-01-17 | Disposition: A | Discharge status: Home / Self Care | Payer: 59 | Source: Ambulatory Visit | Attending: Obstetrics and Gynecology | Admitting: Obstetrics and Gynecology

## 2016-01-17 ENCOUNTER — Encounter (HOSPITAL_COMMUNITY): Payer: Self-pay

## 2016-01-17 DIAGNOSIS — Z3493 Encounter for supervision of normal pregnancy, unspecified, third trimester: Secondary | ICD-10-CM | POA: Insufficient documentation

## 2016-01-17 DIAGNOSIS — Z3A39 39 weeks gestation of pregnancy: Secondary | ICD-10-CM | POA: Insufficient documentation

## 2016-01-17 LAB — URINALYSIS, ROUTINE W REFLEX MICROSCOPIC
Bilirubin Urine: NEGATIVE
GLUCOSE, UA: NEGATIVE mg/dL
Hgb urine dipstick: NEGATIVE
KETONES UR: NEGATIVE mg/dL
LEUKOCYTES UA: NEGATIVE
NITRITE: NEGATIVE
PH: 6 (ref 5.0–8.0)
PROTEIN: NEGATIVE mg/dL
Specific Gravity, Urine: 1.008 (ref 1.005–1.030)

## 2016-01-17 NOTE — Telephone Encounter (Signed)
Preadmission screen  

## 2016-01-17 NOTE — MAU Note (Signed)
Pt reports pain in her lower abd and lower back all day today, seen yesterday and sent home but pain has continued.

## 2016-01-17 NOTE — Discharge Instructions (Signed)
Braxton Hicks Contractions °Contractions of the uterus can occur throughout pregnancy. Contractions are not always a sign that you are in labor.  °WHAT ARE BRAXTON HICKS CONTRACTIONS?  °Contractions that occur before labor are called Braxton Hicks contractions, or false labor. Toward the end of pregnancy (32-34 weeks), these contractions can develop more often and may become more forceful. This is not true labor because these contractions do not result in opening (dilatation) and thinning of the cervix. They are sometimes difficult to tell apart from true labor because these contractions can be forceful and people have different pain tolerances. You should not feel embarrassed if you go to the hospital with false labor. Sometimes, the only way to tell if you are in true labor is for your health care provider to look for changes in the cervix. °If there are no prenatal problems or other health problems associated with the pregnancy, it is completely safe to be sent home with false labor and await the onset of true labor. °HOW CAN YOU TELL THE DIFFERENCE BETWEEN TRUE AND FALSE LABOR? °False Labor  °· The contractions of false labor are usually shorter and not as hard as those of true labor.   °· The contractions are usually irregular.   °· The contractions are often felt in the front of the lower abdomen and in the groin.   °· The contractions may go away when you walk around or change positions while lying down.   °· The contractions get weaker and are shorter lasting as time goes on.   °· The contractions do not usually become progressively stronger, regular, and closer together as with true labor.   °True Labor  °· Contractions in true labor last 30-70 seconds, become very regular, usually become more intense, and increase in frequency.   °· The contractions do not go away with walking.   °· The discomfort is usually felt in the top of the uterus and spreads to the lower abdomen and low back.   °· True labor can be  determined by your health care provider with an exam. This will show that the cervix is dilating and getting thinner.   °WHAT TO REMEMBER °· Keep up with your usual exercises and follow other instructions given by your health care provider.   °· Take medicines as directed by your health care provider.   °· Keep your regular prenatal appointments.   °· Eat and drink lightly if you think you are going into labor.   °· If Braxton Hicks contractions are making you uncomfortable:   °¨ Change your position from lying down or resting to walking, or from walking to resting.   °¨ Sit and rest in a tub of warm water.   °¨ Drink 2-3 glasses of water. Dehydration may cause these contractions.   °¨ Do slow and deep breathing several times an hour.   °WHEN SHOULD I SEEK IMMEDIATE MEDICAL CARE? °Seek immediate medical care if: °· Your contractions become stronger, more regular, and closer together.   °· You have fluid leaking or gushing from your vagina.   °· You have a fever.     °· You have vaginal bleeding.   °· You have continuous abdominal pain.   °· You have low back pain that you never had before.   °· You feel your baby's head pushing down and causing pelvic pressure.   °· Your baby is not moving as much as it used to.   °This information is not intended to replace advice given to you by your health care provider. Make sure you discuss any questions you have with your health care provider. °Document Released: 01/28/2005 Document   Revised: 05/22/2015 Document Reviewed: 11/09/2012 °Elsevier Interactive Patient Education © 2017 [employer]vier Inc. ° °

## 2016-01-18 LAB — CULTURE, BETA STREP (GROUP B ONLY)

## 2016-01-19 ENCOUNTER — Encounter: Payer: Self-pay | Admitting: Obstetrics and Gynecology

## 2016-01-19 DIAGNOSIS — B951 Streptococcus, group B, as the cause of diseases classified elsewhere: Secondary | ICD-10-CM | POA: Insufficient documentation

## 2016-01-20 ENCOUNTER — Encounter (HOSPITAL_COMMUNITY): Payer: Self-pay

## 2016-01-20 ENCOUNTER — Inpatient Hospital Stay (HOSPITAL_COMMUNITY): Payer: 59 | Admitting: Anesthesiology

## 2016-01-20 ENCOUNTER — Inpatient Hospital Stay (HOSPITAL_COMMUNITY)
Admission: AD | Admit: 2016-01-20 | Discharge: 2016-01-23 | DRG: 774 | Disposition: A | Discharge status: Home / Self Care | Payer: 59 | Source: Ambulatory Visit | Attending: Obstetrics & Gynecology | Admitting: Obstetrics & Gynecology

## 2016-01-20 DIAGNOSIS — A5901 Trichomonal vulvovaginitis: Secondary | ICD-10-CM | POA: Diagnosis present

## 2016-01-20 DIAGNOSIS — O24420 Gestational diabetes mellitus in childbirth, diet controlled: Secondary | ICD-10-CM | POA: Diagnosis present

## 2016-01-20 DIAGNOSIS — O99214 Obesity complicating childbirth: Secondary | ICD-10-CM | POA: Diagnosis present

## 2016-01-20 DIAGNOSIS — Z87891 Personal history of nicotine dependence: Secondary | ICD-10-CM

## 2016-01-20 DIAGNOSIS — Z3A40 40 weeks gestation of pregnancy: Secondary | ICD-10-CM

## 2016-01-20 DIAGNOSIS — Z3490 Encounter for supervision of normal pregnancy, unspecified, unspecified trimester: Secondary | ICD-10-CM

## 2016-01-20 DIAGNOSIS — Z8249 Family history of ischemic heart disease and other diseases of the circulatory system: Secondary | ICD-10-CM | POA: Diagnosis not present

## 2016-01-20 DIAGNOSIS — Z6841 Body Mass Index (BMI) 40.0 and over, adult: Secondary | ICD-10-CM

## 2016-01-20 DIAGNOSIS — O9832 Other infections with a predominantly sexual mode of transmission complicating childbirth: Secondary | ICD-10-CM | POA: Diagnosis present

## 2016-01-20 DIAGNOSIS — O23599 Infection of other part of genital tract in pregnancy, unspecified trimester: Secondary | ICD-10-CM

## 2016-01-20 DIAGNOSIS — O99824 Streptococcus B carrier state complicating childbirth: Secondary | ICD-10-CM | POA: Diagnosis present

## 2016-01-20 DIAGNOSIS — B951 Streptococcus, group B, as the cause of diseases classified elsewhere: Secondary | ICD-10-CM

## 2016-01-20 DIAGNOSIS — O0930 Supervision of pregnancy with insufficient antenatal care, unspecified trimester: Secondary | ICD-10-CM

## 2016-01-20 LAB — CBC
HCT: 32.9 % — ABNORMAL LOW (ref 36.0–46.0)
HEMATOCRIT: 34.2 % — AB (ref 36.0–46.0)
HEMOGLOBIN: 11 g/dL — AB (ref 12.0–15.0)
HEMOGLOBIN: 10.6 g/dL — AB (ref 12.0–15.0)
MCH: 26.1 pg (ref 26.0–34.0)
MCH: 26.4 pg (ref 26.0–34.0)
MCHC: 32.2 g/dL (ref 30.0–36.0)
MCHC: 32.2 g/dL (ref 30.0–36.0)
MCV: 81 fL (ref 78.0–100.0)
MCV: 82 fL (ref 78.0–100.0)
PLATELETS: 281 10*3/uL (ref 150–400)
Platelets: 288 10*3/uL (ref 150–400)
RBC: 4.22 MIL/uL (ref 3.87–5.11)
RBC: 4.01 MIL/uL (ref 3.87–5.11)
RDW: 14.2 % (ref 11.5–15.5)
RDW: 14.2 % (ref 11.5–15.5)
WBC: 9.1 10*3/uL (ref 4.0–10.5)
WBC: 10.6 10*3/uL — AB (ref 4.0–10.5)

## 2016-01-20 LAB — GLUCOSE, RANDOM: GLUCOSE: 104 mg/dL — AB (ref 65–99)

## 2016-01-20 LAB — TYPE AND SCREEN
ABO/RH(D): O POS
Antibody Screen: NEGATIVE

## 2016-01-20 LAB — GLUCOSE, CAPILLARY
GLUCOSE-CAPILLARY: 89 mg/dL (ref 65–99)
GLUCOSE-CAPILLARY: 80 mg/dL (ref 65–99)
Glucose-Capillary: 114 mg/dL — ABNORMAL HIGH (ref 65–99)

## 2016-01-20 LAB — PROTEIN / CREATININE RATIO, URINE
Creatinine, Urine: 68 mg/dL
Protein Creatinine Ratio: 0.41 mg/mg{Cre} — ABNORMAL HIGH (ref 0.00–0.15)
Total Protein, Urine: 28 mg/dL

## 2016-01-20 LAB — ABO/RH: ABO/RH(D): O POS

## 2016-01-20 MED ORDER — PHENYLEPHRINE 40 MCG/ML (10ML) SYRINGE FOR IV PUSH (FOR BLOOD PRESSURE SUPPORT)
80.0000 ug | PREFILLED_SYRINGE | INTRAVENOUS | Status: DC | PRN
  Filled 2016-01-20: qty 5

## 2016-01-20 MED ORDER — OXYCODONE-ACETAMINOPHEN 5-325 MG PO TABS: 2.0000 | ORAL_TABLET | ORAL | Status: DC | PRN

## 2016-01-20 MED ORDER — PHENYLEPHRINE 40 MCG/ML (10ML) SYRINGE FOR IV PUSH (FOR BLOOD PRESSURE SUPPORT)
80.0000 ug | PREFILLED_SYRINGE | INTRAVENOUS | Status: DC | PRN
  Filled 2016-01-20: qty 5
  Filled 2016-01-20: qty 10

## 2016-01-20 MED ORDER — EPHEDRINE 5 MG/ML INJ
10.0000 mg | INTRAVENOUS | Status: DC | PRN
  Filled 2016-01-20: qty 4

## 2016-01-20 MED ORDER — PENICILLIN G POTASSIUM 5000000 UNITS IJ SOLR
5.0000 10*6.[IU] | Freq: Once | INTRAMUSCULAR | Status: DC
  Filled 2016-01-20: qty 5

## 2016-01-20 MED ORDER — OXYCODONE-ACETAMINOPHEN 5-325 MG PO TABS: 1.0000 | ORAL_TABLET | ORAL | Status: DC | PRN

## 2016-01-20 MED ORDER — DIPHENHYDRAMINE HCL 50 MG/ML IJ SOLN: 12.5000 mg | INTRAMUSCULAR | Status: DC | PRN

## 2016-01-20 MED ORDER — LACTATED RINGERS IV SOLN: 500.0000 mL | Freq: Once | INTRAVENOUS | Status: DC

## 2016-01-20 MED ORDER — SOD CITRATE-CITRIC ACID 500-334 MG/5ML PO SOLN: 30.0000 mL | ORAL | Status: DC | PRN

## 2016-01-20 MED ORDER — PENICILLIN G POTASSIUM 5000000 UNITS IJ SOLR
5.0000 10*6.[IU] | Freq: Once | INTRAMUSCULAR | Status: DC
  Administered 2016-01-20: 5 10*6.[IU] via INTRAMUSCULAR
  Filled 2016-01-20: qty 5

## 2016-01-20 MED ORDER — LACTATED RINGERS IV SOLN
INTRAVENOUS | Status: DC
  Administered 2016-01-20 (×2): via INTRAVENOUS

## 2016-01-20 MED ORDER — TERBUTALINE SULFATE 1 MG/ML IJ SOLN
0.2500 mg | Freq: Once | INTRAMUSCULAR | Status: DC | PRN
  Filled 2016-01-20: qty 1

## 2016-01-20 MED ORDER — OXYTOCIN BOLUS FROM INFUSION
500.0000 mL | Freq: Once | INTRAVENOUS | Status: AC
  Administered 2016-01-21: 500 mL via INTRAVENOUS

## 2016-01-20 MED ORDER — ACETAMINOPHEN 325 MG PO TABS: 650.0000 mg | ORAL_TABLET | ORAL | Status: DC | PRN

## 2016-01-20 MED ORDER — ONDANSETRON HCL 4 MG/2ML IJ SOLN: 4.0000 mg | Freq: Four times a day (QID) | INTRAMUSCULAR | Status: DC | PRN

## 2016-01-20 MED ORDER — LIDOCAINE HCL (PF) 1 % IJ SOLN
INTRAMUSCULAR | Status: DC | PRN
  Administered 2016-01-20: 6 mL via EPIDURAL
  Administered 2016-01-20: 8 mL via EPIDURAL

## 2016-01-20 MED ORDER — LACTATED RINGERS IV SOLN: 500.0000 mL | INTRAVENOUS | Status: DC | PRN

## 2016-01-20 MED ORDER — OXYTOCIN 40 UNITS IN LACTATED RINGERS INFUSION - SIMPLE MED
1.0000 m[IU]/min | INTRAVENOUS | Status: DC
  Administered 2016-01-20: 2 m[IU]/min via INTRAVENOUS
  Filled 2016-01-20: qty 1000

## 2016-01-20 MED ORDER — PENICILLIN G POT IN DEXTROSE 60000 UNIT/ML IV SOLN
3.0000 10*6.[IU] | INTRAVENOUS | Status: DC
  Administered 2016-01-20 (×2): 3 10*6.[IU] via INTRAVENOUS
  Filled 2016-01-20 (×7): qty 50

## 2016-01-20 MED ORDER — FENTANYL 2.5 MCG/ML BUPIVACAINE 1/10 % EPIDURAL INFUSION (WH - ANES)
14.0000 mL/h | INTRAMUSCULAR | Status: DC | PRN
  Administered 2016-01-20 (×2): 14 mL/h via EPIDURAL
  Filled 2016-01-20 (×2): qty 100

## 2016-01-20 MED ORDER — LIDOCAINE HCL (PF) 1 % IJ SOLN
30.0000 mL | INTRAMUSCULAR | Status: DC | PRN
  Filled 2016-01-20: qty 30

## 2016-01-20 MED ORDER — OXYTOCIN 40 UNITS IN LACTATED RINGERS INFUSION - SIMPLE MED: 2.5000 [IU]/h | INTRAVENOUS | Status: DC

## 2016-01-20 NOTE — Anesthesia Preprocedure Evaluation (Signed)
Anesthesia Evaluation  Patient identified by MRN, date of birth, ID band Patient awake    Reviewed: Allergy & Precautions, H&P , NPO status , Patient's Chart, lab work & pertinent test results  Airway Mallampati: III  TM Distance: >3 FB Neck ROM: full    Dental no notable dental hx.    Pulmonary neg pulmonary ROS, former smoker,    Pulmonary exam normal        Cardiovascular negative cardio ROS Normal cardiovascular exam     Neuro/Psych negative neurological ROS  negative psych ROS   GI/Hepatic negative GI ROS, Neg liver ROS,   Endo/Other  diabetesMorbid obesity  Renal/GU negative Renal ROS     Musculoskeletal   Abdominal (+) + obese,   Peds  Hematology negative hematology ROS (+)   Anesthesia Other Findings   Reproductive/Obstetrics (+) Pregnancy                             Anesthesia Physical Anesthesia Plan  ASA: III  Anesthesia Plan: Epidural   Post-op Pain Management:    Induction:   Airway Management Planned:   Additional Equipment:   Intra-op Plan:   Post-operative Plan:   Informed Consent: I have reviewed the patients History and Physical, chart, labs and discussed the procedure including the risks, benefits and alternatives for the proposed anesthesia with the patient or authorized representative who has indicated his/her understanding and acceptance.     Plan Discussed with: CRNA and Surgeon  Anesthesia Plan Comments:         Anesthesia Quick Evaluation

## 2016-01-20 NOTE — H&P (Signed)
Kelly Willis is a 20 y.o. female presenting for Induction of labor for GDM . OB History    Gravida Para Term Preterm AB Living   1 0 0 0 0 0   SAB TAB Ectopic Multiple Live Births   0 0 0 0 0     Past Medical History:  Diagnosis Date  . ADHD (attention deficit hyperactivity disorder)   . Gestational diabetes   . Obesity   . Pilonidal cyst 06/2014   Past Surgical History:  Procedure Laterality Date  . PILONIDAL CYST EXCISION    . PILONIDAL CYST EXCISION N/A 07/08/2014   Procedure: CYST EXCISION PILONIDAL ;  Surgeon: Avel Peaceodd Rosenbower, MD;  Location: [facility];  Service: General;  Laterality: N/A;   Family History: family history includes Hypertension in her maternal grandfather. Social History:  reports that she quit smoking about 5 months ago. Her smoking use included Cigarettes. She quit after 2.00 years of use. She has never used smokeless tobacco. She reports that she does not drink alcohol or use drugs.     Maternal Diabetes: Yes:  Diabetes Type:  Diet controlled Genetic Screening: Declined Maternal Ultrasounds/Referrals: Normal 63% at 33.5wks (5+2) Fetal Ultrasounds or other Referrals:  None Maternal Substance Abuse:  No  Drug screen pending Significant Maternal Medications:  None Significant Maternal Lab Results:  None  GBS + Other Comments:  None   Very limited prenatal care  Review of Systems  Constitutional: Negative for chills and fever.  Respiratory: Negative for shortness of breath.   Cardiovascular: Negative for chest pain.  Gastrointestinal: Negative for abdominal pain, constipation, diarrhea, nausea and vomiting.  Genitourinary: Negative for dysuria.  Musculoskeletal: Negative for back pain.   Maternal Medical History:  Reason for admission: Nausea. Induction for GDM   Contractions: Frequency: irregular.   Perceived severity is mild.    Fetal activity: Perceived fetal activity is normal.   Last perceived fetal movement was within the  past hour.    Prenatal complications: No bleeding, HIV, PIH, placental abnormality, polyhydramnios, pre-eclampsia or preterm labor.   Prenatal Complications - Diabetes: gestational. Diabetes is managed by diet.      Dilation: 3 Effacement (%): 70 Station: -1 Exam by:: Artelia LarocheM. Eilidh Marcano, CNM Blood pressure 131/85, pulse (!) 112, temperature 98 F (36.7 C), temperature source Oral, resp. rate 18, height 5\' 6"  (1.676 m), weight (!) 315 lb (142.9 kg), last menstrual period 04/15/2015, unknown if currently breastfeeding. Maternal Exam:  Uterine Assessment: Contraction strength is mild.  Contraction frequency is irregular.   Abdomen: Patient reports no abdominal tenderness. Fundal height is 39.   Estimated fetal weight is 7.   Fetal presentation: vertex  Introitus: Normal vulva. Normal vagina.  Vagina is negative for discharge.  Ferning test: not done.  Nitrazine test: not done. Amniotic fluid character: not assessed.  Pelvis: adequate for delivery.   Cervix: Cervix evaluated by digital exam.     Fetal Exam Fetal Monitor Review: Mode: ultrasound.   Baseline rate: 140.  Variability: moderate (6-25 bpm).   Pattern: accelerations present and no decelerations.    Fetal State Assessment: Category I - tracings are normal.     Physical Exam  Constitutional: She is oriented to person, place, and time. She appears well-developed and well-nourished. No distress.  HENT:  Head: Normocephalic.  Cardiovascular: Normal rate and regular rhythm.   Respiratory: Effort normal and breath sounds normal. No respiratory distress. She has no wheezes. She has no rales.  GI: Soft. She exhibits no distension. There  is no tenderness. There is no rebound and no guarding.  Genitourinary: No vaginal discharge found.  Musculoskeletal: Normal range of motion.  Neurological: She is alert and oriented to person, place, and time.  Skin: Skin is warm and dry.  Psychiatric: She has a normal mood and affect.    Dilation: 3 Effacement (%): 70 Cervical Position: Middle Station: -1 Presentation: Vertex Exam by:: Artelia LarocheM. Consuelo Thayne, CNM  Prenatal labs: ABO, Rh: --/--/O POS (12/09 0840) Antibody: NEG (12/09 0840) Rubella: 6.16 (07/12 1604) RPR: Non Reactive (09/25 1100)  HBsAg: Negative (07/12 1604)  HIV: Non Reactive (09/25 1100)  GBS: Positive (12/05 0000)   Assessment/Plan: SIUP at 6475w0d GDM GBS +  [facility] Routine orders Foley balloon placed Will start Pitocin  Penicillin for GBS  [facility] CenterWILLIAMS,Luverne Zerkle 01/20/2016, 11:43 AM

## 2016-01-20 NOTE — Anesthesia Procedure Notes (Signed)
Epidural Patient location during procedure: OB Start time: 01/20/2016 12:33 PM End time: 01/20/2016 12:37 PM  Staffing Anesthesiologist: Leilani AbleHATCHETT, Tais Koestner Performed: anesthesiologist   Preanesthetic Checklist Completed: patient identified, surgical consent, pre-op evaluation, timeout performed, IV checked, risks and benefits discussed and monitors and equipment checked  Epidural Patient position: sitting Prep: site prepped and draped and DuraPrep Patient monitoring: continuous pulse ox and blood pressure Approach: midline Location: L3-L4 Injection technique: LOR air  Needle:  Needle type: Tuohy  Needle gauge: 17 G Needle length: 9 cm and 9 Needle insertion depth: 8 cm Catheter type: closed end flexible Catheter size: 19 Gauge Catheter at skin depth: 13 cm Test dose: negative and Other  Assessment Sensory level: T9 Events: blood not aspirated, injection not painful, no injection resistance, negative IV test and no paresthesia  Additional Notes Reason for block:procedure for pain

## 2016-01-20 NOTE — Anesthesia Pain Management Evaluation Note (Signed)
  CRNA Pain Management Visit Note  Patient: Kelly SangerChaneldra Wion, 20 y.o., female  "Hello I am a member of the anesthesia team at [facility]. We have an anesthesia team available at all times to provide care throughout the hospital, including epidural management and anesthesia for C-section. I don't know your plan for the delivery whether it a natural birth, water birth, IV sedation, nitrous supplementation, doula or epidural, but we want to meet your pain goals."   1.Was your pain managed to your expectations on prior hospitalizations?   No prior hospitalizations  2.What is your expectation for pain management during this hospitalization?     Epidural  3.How can we help you reach that goal? unsure  Record the patient's initial score and the patient's pain goal.   Pain: 0  Pain Goal: 8 [facility] wants you to be able to say your pain was always managed very well.  Cephus ShellingBURGER,Wadsworth Skolnick 01/20/2016

## 2016-01-20 NOTE — Progress Notes (Signed)
Patient ID: Kelly [name], female   DOB: 01-12-1996, 20 y.o.   MRN: [id number]  Doing well, comfortable  Vitals:   01/20/16 1530 01/20/16 1600 01/20/16 1630 01/20/16 1700  BP: 128/83 138/78 136/71 138/85  Pulse: (!) 124 (!) 136 (!) 139 (!) 136  Resp: 16     Temp:      TempSrc:      Weight:      Height:        FHR with ? decels with UCs, ? Late Difficult to ascertain timing with toco  AROM clear fluid  IUPC inserted  May need increased Pitocin

## 2016-01-21 ENCOUNTER — Encounter (HOSPITAL_COMMUNITY): Payer: Self-pay

## 2016-01-21 DIAGNOSIS — O99824 Streptococcus B carrier state complicating childbirth: Secondary | ICD-10-CM

## 2016-01-21 DIAGNOSIS — Z3A40 40 weeks gestation of pregnancy: Secondary | ICD-10-CM

## 2016-01-21 DIAGNOSIS — O24420 Gestational diabetes mellitus in childbirth, diet controlled: Secondary | ICD-10-CM

## 2016-01-21 LAB — COMPREHENSIVE METABOLIC PANEL WITH GFR
ALT: 17 U/L (ref 14–54)
AST: 23 U/L (ref 15–41)
Albumin: 2.9 g/dL — ABNORMAL LOW (ref 3.5–5.0)
Alkaline Phosphatase: 175 U/L — ABNORMAL HIGH (ref 38–126)
Anion gap: 8 (ref 5–15)
BUN: 7 mg/dL (ref 6–20)
CO2: 24 mmol/L (ref 22–32)
Calcium: 9.6 mg/dL (ref 8.9–10.3)
Chloride: 105 mmol/L (ref 101–111)
Creatinine, Ser: 0.7 mg/dL (ref 0.44–1.00)
GFR calc Af Amer: >60 mL/min
GFR calc non Af Amer: >60 mL/min
Glucose, Bld: 100 mg/dL — ABNORMAL HIGH (ref 65–99)
Potassium: 4.1 mmol/L (ref 3.5–5.1)
Sodium: 137 mmol/L (ref 135–145)
Total Bilirubin: 0.8 mg/dL (ref 0.3–1.2)
Total Protein: 6.7 g/dL (ref 6.5–8.1)

## 2016-01-21 LAB — RPR: RPR Ser Ql: NONREACTIVE

## 2016-01-21 MED ORDER — SENNOSIDES-DOCUSATE SODIUM 8.6-50 MG PO TABS
2.0000 | ORAL_TABLET | ORAL | Status: DC
  Administered 2016-01-22 (×2): 2 via ORAL
  Filled 2016-01-21 (×3): qty 2

## 2016-01-21 MED ORDER — DIPHENHYDRAMINE HCL 25 MG PO CAPS: 25.0000 mg | ORAL_CAPSULE | Freq: Four times a day (QID) | ORAL | Status: DC | PRN

## 2016-01-21 MED ORDER — ZOLPIDEM TARTRATE 5 MG PO TABS: 5.0000 mg | ORAL_TABLET | Freq: Every evening | ORAL | Status: DC | PRN

## 2016-01-21 MED ORDER — IBUPROFEN 600 MG PO TABS
600.0000 mg | ORAL_TABLET | Freq: Four times a day (QID) | ORAL | Status: DC
  Administered 2016-01-21 – 2016-01-23 (×10): 600 mg via ORAL
  Filled 2016-01-21 (×10): qty 1

## 2016-01-21 MED ORDER — DIBUCAINE 1 % RE OINT: 1.0000 "application " | TOPICAL_OINTMENT | RECTAL | Status: DC | PRN

## 2016-01-21 MED ORDER — SIMETHICONE 80 MG PO CHEW
80.0000 mg | CHEWABLE_TABLET | ORAL | Status: DC | PRN
Start: 2016-01-21 — End: 2016-01-23

## 2016-01-21 MED ORDER — ACETAMINOPHEN 325 MG PO TABS: 650.0000 mg | ORAL_TABLET | ORAL | Status: DC | PRN

## 2016-01-21 MED ORDER — COCONUT OIL OIL: 1.0000 "application " | TOPICAL_OIL | Status: DC | PRN

## 2016-01-21 MED ORDER — WITCH HAZEL-GLYCERIN EX PADS: 1.0000 "application " | MEDICATED_PAD | CUTANEOUS | Status: DC | PRN

## 2016-01-21 MED ORDER — BENZOCAINE-MENTHOL 20-0.5 % EX AERO
1.0000 "application " | INHALATION_SPRAY | CUTANEOUS | Status: DC | PRN
  Filled 2016-01-21: qty 56

## 2016-01-21 MED ORDER — TETANUS-DIPHTH-ACELL PERTUSSIS 5-2.5-18.5 LF-MCG/0.5 IM SUSP: 0.5000 mL | Freq: Once | INTRAMUSCULAR | Status: DC

## 2016-01-21 MED ORDER — ONDANSETRON HCL 4 MG PO TABS: 4.0000 mg | ORAL_TABLET | ORAL | Status: DC | PRN

## 2016-01-21 MED ORDER — ONDANSETRON HCL 4 MG/2ML IJ SOLN: 4.0000 mg | INTRAMUSCULAR | Status: DC | PRN

## 2016-01-21 MED ORDER — PRENATAL MULTIVITAMIN CH
1.0000 | ORAL_TABLET | Freq: Every day | ORAL | Status: DC
  Administered 2016-01-21 – 2016-01-23 (×3): 1 via ORAL
  Filled 2016-01-21 (×3): qty 1

## 2016-01-21 NOTE — Anesthesia Postprocedure Evaluation (Signed)
Anesthesia Post Note  Patient: Kelly Willis  Procedure(s) Performed: * No procedures listed *  Patient location during evaluation: Mother Baby Anesthesia Type: Epidural Level of consciousness: awake and alert Pain management: pain level controlled Vital Signs Assessment: post-procedure vital signs reviewed and stable Respiratory status: spontaneous breathing, nonlabored ventilation and respiratory function stable Cardiovascular status: stable Postop Assessment: no headache, no backache, epidural receding and patient able to bend at knees Anesthetic complications: no     Last Vitals:  Vitals:   01/21/16 0455 01/21/16 0908  BP: (!) 107/53 (!) 119/58  Pulse: (!) 112 89  Resp: 18 20  Temp: 36.7 C 36.9 C    Last Pain:  Vitals:   01/21/16 1214  TempSrc:   PainSc: 0-No pain   Pain Goal: Patients Stated Pain Goal: 3 (01/20/16 1200)               Rica RecordsICKELTON,Cebastian Neis

## 2016-01-22 NOTE — Clinical Social Work Maternal (Signed)
CLINICAL SOCIAL WORK MATERNAL/CHILD NOTE  Patient Details  Name: Kelly Willis MRN: [id number] Date of Birth: 09/29/1995  Date:  01/22/2016  Clinical Social Worker Initiating Note:  Burman Bruington N Bohden Dung, LCSW MSW   Date/ Time Initiated:  01/22/16/1140         Child's Name:  Kenya   Legal Guardian:  Mother   Need for Interpreter:  None   Date of Referral:  01/22/16     Reason for Referral:  Other (Comment) (suspicion of THC/smell of THC in room and on unit.)   Referral Source:  Physician   Address:     Phone number:      Household Members: Self, Parents   Natural Supports (not living in the home): Spouse/significant other, Friends, Immediate Family   Professional Supports:None   Employment:Unemployed   Type of Work: not currently working at this time.   Education:  High school graduate   Financial Resources:Private Insurance, Medicaid   Other Resources: WIC, Food Stamps    Cultural/Religious Considerations Which May Impact Care: none  reported  Strengths: Ability to meet basic needs , Home prepared for child , Pediatrician chosen    Risk Factors/Current Problems: Substance Use    Cognitive State: Able to Concentrate , Alert , Goal Oriented , Linear Thinking    Mood/Affect: Anxious , Calm , Interested    CSW Assessment:LCSW completed assessment with MOB and FOB at the bedside with MOB's permission to speak about substance use and smell of THC in room, by providers, and RNs.    MOB and FOB very open in discussing about concerns of substance abuse and smelling of THC on the unit.  MOB denied use as well as FOB reporting " We have had many guest and some of them do smoke".  LCSW explained concerns of staff with regards to newborn being surrounded by by substances in which MOB reports this is not the case. Reports she is from [place] and still lives with her parents who are involved and supportive. Reports this is first grand  baby for FOB and his mother is also very supportive. MOB reports she is not currently working, but plans to stay home with baby for a few months and then may look for work.  FOB is working and supporting child and MOB. LCSW assessed for other needs including supplies for baby in which MOB reports she has a crib, clothing, WIC appointment set up as she plans to bottle feed, and car seat. MOB reports she has a 3&1 car seat, however not a carrier.  LCSW explored with MOB if family could assist with a carrier in which she states they are trying to help.  MOB aware if she is unable to get car seat the LCSW can facilitate other options.   Discussed follow up care for MOB and baby in which MOB will have baby follow up at [facility]. Baby has medicaid and MOB will have private insurance. FOB discussed mood swings with MOB, however no concerns with depression or anxiety, just anger at times and fighting amongst the two.  LCSW spent time to educate and discuss post partum depression and anxiety as this is MOB's first baby and signs/symptoms to be aware of.  Both MOB and FOB voiced understanding of education and appreciation.  Also discussed safe sleep and co-sleeping risks.   No other needs discussed or questioned. LCSW made MOB and FOB aware of drug screen UDS for baby and cord in which if positive CPS report   will be made in which they voiced understanding and no concerns/questions. Will follow cord. No barriers for DC at this time.  CSW Plan/Description: Information/Referral to Community Resources , No Further Intervention Required/No Barriers to Discharge    Rickita Forstner N, LCSW 01/22/2016, 11:45 AM  

## 2016-01-22 NOTE — Progress Notes (Signed)
Post Partum Day 1 Subjective: no complaints, up ad lib, voiding and tolerating PO  Objective: Blood pressure 132/72, pulse 89, temperature 98.4 F (36.9 C), temperature source Oral, resp. rate 18, height 5\' 6"  (1.676 m), weight (!) 315 lb (142.9 kg), last menstrual period 04/15/2015, SpO2 99 %, unknown if currently breastfeeding.  Physical Exam:  General: alert, cooperative, appears stated age and no distress Lochia: appropriate Uterine Fundus: firm Incision: n/a DVT Evaluation: No evidence of DVT seen on physical exam.   Recent Labs  01/20/16 0840 01/20/16 2332  HGB 11.0* 10.6*  HCT 34.2* 32.9*    Assessment/Plan: Plan for discharge tomorrow   LOS: 2 days   Wyvonnia DuskyMarie Brevon Dewald 01/22/2016, 5:50 AM

## 2016-01-23 MED ORDER — ACETAMINOPHEN 325 MG PO TABS: 650.0000 mg | ORAL_TABLET | ORAL | 0 refills | Status: DC | PRN

## 2016-01-23 NOTE — Discharge Instructions (Signed)

## 2016-01-23 NOTE — Discharge Summary (Signed)
OB Discharge Summary     Patient Name: Kelly Willis DOB: Jul 21, 1995 MRN: [id number]  Date of admission: 01/20/2016 Delivering MD: Clayton BiblesWEINHOLD, SAMANTHA COCHRANE   Date of discharge: 01/23/2016  Admitting diagnosis: induction Intrauterine pregnancy: 3664w1d     Secondary diagnosis:  Active Problems:   Pregnant   Trichomonal vaginitis during pregnancy   Insufficient prenatal care  Additional problems: none     Discharge diagnosis: Term Pregnancy Delivered                                                                                                Post partum procedures:none  Augmentation: AROM, Pitocin and Foley Balloon  Complications: None  Hospital course:  Onset of Labor With Vaginal Delivery     20 y.o. yo G1P1001 at 6864w1d was admitted in Latent Labor on 01/20/2016. Patient had an uncomplicated labor course as follows:  Membrane Rupture Time/Date: 5:38 PM ,01/20/2016   Intrapartum Procedures: Episiotomy: None [1]                                         Lacerations:  None [1]  Patient had a delivery of a Viable infant. 01/21/2016  Information for the patient's newborn:  Kelly Willis, Girl Genoveva [[id number]][[id number]]  Delivery Method: Vaginal, Spontaneous Delivery (Filed from Delivery Summary)    Pateint had an uncomplicated postpartum course.  She is ambulating, tolerating a regular diet, passing flatus, and urinating well. Patient is discharged home in stable condition on 01/23/16.    Physical exam Vitals:   01/21/16 0908 01/21/16 1625 01/22/16 0538 01/23/16 0706  BP: (!) 119/58 132/72 (!) 114/43 125/65  Pulse: 89 89 86 90  Resp: 20 18 18 18   Temp: 98.4 F (36.9 C) 98.4 F (36.9 C) 98 F (36.7 C) 98.6 F (37 C)  TempSrc: Axillary Oral Oral Oral  SpO2: 99%     Weight:      Height:       General: alert Lochia: appropriate Uterine Fundus: firm Incision: N/A DVT Evaluation: No evidence of DVT seen on physical exam. Negative Homan's sign. No cords or calf  tenderness. No significant calf/ankle edema. Labs: Lab Results  Component Value Date   WBC 10.6 (H) 01/20/2016   HGB 10.6 (L) 01/20/2016   HCT 32.9 (L) 01/20/2016   MCV 82.0 01/20/2016   PLT 281 01/20/2016   CMP Latest Ref Rng & Units 01/20/2016  Glucose 65 - 99 mg/dL 782(N100(H)  BUN 6 - 20 mg/dL 7  Creatinine 5.620.44 - 1.301.00 mg/dL 8.650.70  Sodium 784135 - 696145 mmol/L 137  Potassium 3.5 - 5.1 mmol/L 4.1  Chloride 101 - 111 mmol/L 105  CO2 22 - 32 mmol/L 24  Calcium 8.9 - 10.3 mg/dL 9.6  Total Protein 6.5 - 8.1 g/dL 6.7  Total Bilirubin 0.3 - 1.2 mg/dL 0.8  Alkaline Phos 38 - 126 U/L 175(H)  AST 15 - 41 U/L 23  ALT 14 - 54 U/L 17    Discharge instruction: per After Visit Summary and "Baby  and Me Booklet".  After visit meds:    Medication List    STOP taking these medications   promethazine 25 MG tablet Commonly known as:  PHENERGAN     TAKE these medications   acetaminophen 325 MG tablet Commonly known as:  TYLENOL Take 2 tablets (650 mg total) by mouth every 4 (four) hours as needed (for pain scale < 4).       Diet: routine diet  Activity: Advance as tolerated. Pelvic rest for 6 weeks.   Outpatient follow up:6 weeks Follow up Appt:No future appointments. Follow up Visit:No Follow-up on file.  Postpartum contraception: Undecided  Newborn Data: Live born female  Birth Weight: 6 lb 15.8 oz (3170 g) APGAR: 8, 9  Baby Feeding: Breast Disposition:home with mother   01/23/2016 Kelly Willis, Kelly I, DO  PGY-3   OB FELLOW DISCHARGE ATTESTATION  Willis have seen and examined this patient and agree with above documentation in the resident's note.   Jen MowElizabeth Amran Malter, DO OB Fellow

## 2016-02-19 ENCOUNTER — Ambulatory Visit: Payer: 59 | Admitting: Obstetrics and Gynecology

## 2016-03-11 ENCOUNTER — Ambulatory Visit: Payer: 59 | Admitting: Obstetrics

## 2016-04-11 ENCOUNTER — Encounter: Payer: Self-pay | Admitting: Obstetrics

## 2016-04-11 ENCOUNTER — Ambulatory Visit (INDEPENDENT_AMBULATORY_CARE_PROVIDER_SITE_OTHER): Payer: 59 | Admitting: Obstetrics

## 2016-04-11 DIAGNOSIS — Z3202 Encounter for pregnancy test, result negative: Secondary | ICD-10-CM | POA: Diagnosis not present

## 2016-04-11 DIAGNOSIS — L989 Disorder of the skin and subcutaneous tissue, unspecified: Secondary | ICD-10-CM

## 2016-04-11 DIAGNOSIS — L0293 Carbuncle, unspecified: Secondary | ICD-10-CM

## 2016-04-11 DIAGNOSIS — Z7251 High risk heterosexual behavior: Secondary | ICD-10-CM

## 2016-04-11 DIAGNOSIS — Z30013 Encounter for initial prescription of injectable contraceptive: Secondary | ICD-10-CM

## 2016-04-11 LAB — POCT URINE PREGNANCY: Preg Test, Ur: NEGATIVE

## 2016-04-11 MED ORDER — CLINDAMYCIN HCL 300 MG PO CAPS: 300.0000 mg | ORAL_CAPSULE | Freq: Three times a day (TID) | ORAL | 4 refills | Status: DC

## 2016-04-11 MED ORDER — MEDROXYPROGESTERONE ACETATE 150 MG/ML IM SUSP: 150.0000 mg | INTRAMUSCULAR | 4 refills | Status: DC

## 2016-04-11 NOTE — Progress Notes (Signed)
Post Partum Exam  Kelly Willis is a 21 y.o. 371P1001 female who presents for a postpartum visit. She is 2 months postpartum following a spontaneous vaginal delivery. I have fully reviewed the prenatal and intrapartum course. The delivery was at 8339w1d gestational weeks.  Anesthesia: epidural. Postpartum course has been unremarkable . Baby's course has been unremarkable. Baby is feeding by bottle - Similac Advance. Bleeding no bleeding. Bowel function is normal. Bladder function is normal. Patient is sexually active. Contraception method is none. Postpartum depression screening:positive. Pt wishes to start Depo ASAP. EPS: 18  The following portions of the patient's history were reviewed and updated as appropriate: allergies, current medications, past family history, past medical history, past social history, past surgical history and problem list.  Review of Systems A comprehensive review of systems was negative except for: Behavioral/Psych: positive for depression    Objective:  unknown if currently breastfeeding.  General:  alert and no distress   Breasts:  Multiple healing Carbuncles on underside  Lungs: clear to auscultation bilaterally  Heart:  regular rate and rhythm, S1, S2 normal, no murmur, click, rub or gallop  Abdomen: soft, non-tender; bowel sounds normal; no masses,  no organomegaly   Vulva:  normal  Vagina: normal vagina  Cervix:  no cervical motion tenderness  Corpus: normal size, contour, position, consistency, mobility, non-tender  Adnexa:  no mass, fullness, tenderness  Rectal Exam: Not performed.        Assessment:    Multiple healing Carbuncles on underside of breasts and inner buttocks / thigh. Pap smear not done at today's visit.   Contraceptive Counseling and Advice  Postpartum Depression  Plan:   Rx:  Clindamycin Rx for chronic Carbuncles  1. Contraception: Depo-Provera injections 2. Depo Provera Rx 3. Counseling recommended for Depression 4. Follow up in:  2 weeks

## 2016-04-15 ENCOUNTER — Other Ambulatory Visit: Payer: Self-pay | Admitting: Obstetrics

## 2016-04-15 LAB — ANAEROBIC AND AEROBIC CULTURE

## 2016-04-25 ENCOUNTER — Ambulatory Visit: Payer: 59

## 2016-07-25 ENCOUNTER — Telehealth: Payer: Self-pay

## 2016-07-25 DIAGNOSIS — Z30013 Encounter for initial prescription of injectable contraceptive: Secondary | ICD-10-CM

## 2016-07-25 MED ORDER — MEDROXYPROGESTERONE ACETATE 150 MG/ML IM SUSP: 150.0000 mg | INTRAMUSCULAR | 4 refills | Status: DC

## 2016-07-25 NOTE — Telephone Encounter (Signed)
Pt called requesting that depo refill be sent to her pharmacy. Pt aware that she has to have pregnancy test done before depo can be restarted due to being outside of window. Appt schedule for 6/25

## 2016-12-23 ENCOUNTER — Encounter (HOSPITAL_BASED_OUTPATIENT_CLINIC_OR_DEPARTMENT_OTHER): Payer: Self-pay

## 2016-12-23 ENCOUNTER — Emergency Department (HOSPITAL_BASED_OUTPATIENT_CLINIC_OR_DEPARTMENT_OTHER): Payer: 59

## 2016-12-23 ENCOUNTER — Emergency Department (HOSPITAL_BASED_OUTPATIENT_CLINIC_OR_DEPARTMENT_OTHER)
Admission: EM | Admit: 2016-12-23 | Discharge: 2016-12-23 | Disposition: A | Discharge status: Home / Self Care | Payer: 59 | Attending: Emergency Medicine | Admitting: Emergency Medicine

## 2016-12-23 DIAGNOSIS — M79672 Pain in left foot: Secondary | ICD-10-CM | POA: Diagnosis present

## 2016-12-23 DIAGNOSIS — F17210 Nicotine dependence, cigarettes, uncomplicated: Secondary | ICD-10-CM | POA: Diagnosis not present

## 2016-12-23 MED ORDER — IBUPROFEN 800 MG PO TABS
800.0000 mg | ORAL_TABLET | Freq: Once | ORAL | Status: AC
  Administered 2016-12-23: 800 mg via ORAL
  Filled 2016-12-23: qty 1

## 2016-12-23 NOTE — ED Provider Notes (Signed)
[facility] EMERGENCY DEPARTMENT Provider Note   CSN: [id number] Arrival date & time: 12/23/16  1618     History   Chief Complaint Chief Complaint  Patient presents with  . Foot Pain    HPI Fatima SangerChaneldra Fonda is a 21 y.o. female.  The history is provided by the patient.  Foot Pain  This is a new problem. The current episode started 6 to 12 hours ago. The problem occurs constantly. The problem has not changed since onset.The symptoms are aggravated by walking. Nothing relieves the symptoms. She has tried nothing for the symptoms.   21 year old female with history of obesity who presents with atraumatic left foot pain.  Pain localized to the dorsal surface over the lateral side of the right foot and associated with soft tissue swelling over foot.  Has been having difficulty ambulating today.  Denies being on her feet significantly or any trauma or injury.  No fevers or chills.  No leg pain or leg swelling. Past Medical History:  Diagnosis Date  . ADHD (attention deficit hyperactivity disorder)   . Gestational diabetes   . Obesity   . Pilonidal cyst 06/2014    Patient Active Problem List   Diagnosis Date Noted  . Pregnant 01/20/2016  . Trichomonal vaginitis during pregnancy 01/20/2016  . Insufficient prenatal care 01/20/2016  . Positive GBS test 01/19/2016  . GDM (gestational diabetes mellitus) 11/21/2015  . Supervision of normal first pregnancy, antepartum 08/23/2015  . Late prenatal care 08/23/2015  . Pilonidal abscess 10/16/2012    Past Surgical History:  Procedure Laterality Date  . PILONIDAL CYST EXCISION      OB History    Gravida Para Term Preterm AB Living   1 1 1  0 0 1   SAB TAB Ectopic Multiple Live Births   0 0 0 0 1       Home Medications    Prior to Admission medications   Not on File    Family History Family History  Problem Relation Age of Onset  . Hypertension Maternal Grandfather     Social History Social History   Tobacco  Use  . Smoking status: Current Every Day Smoker    Years: 2.00    Types: Cigarettes  . Smokeless tobacco: Never Used  . Tobacco comment: 4 cig./day  Substance Use Topics  . Alcohol use: Yes    Comment: occasionally  . Drug use: No     Allergies   Pepto-bismol [bismuth subsalicylate]   Review of Systems Review of Systems  Constitutional: Negative for fever.  Musculoskeletal:       Left foot pain  Skin: Negative for wound.  Allergic/Immunologic: Negative for immunocompromised state.  Neurological: Negative for weakness and numbness.  Hematological: Does not bruise/bleed easily.     Physical Exam Updated Vital Signs BP (!) 150/92 (BP Location: Right Arm)   Pulse 85   Temp 98.1 F (36.7 C) (Oral)   Resp 18   Ht 5\' 7"  (1.702 m)   Wt 132.5 kg (292 lb)   LMP 12/15/2016   SpO2 100%   BMI 45.73 kg/m   Physical Exam Physical Exam  Constitutional: Appears well-developed and well-nourished. No acute distress. HENT:  Head: Normocephalic.  Eyes: Conjunctivae are normal.  Cardiovascular: Normal rate and intact distal pulses.  +2 DP pulses bilaterally Pulmonary/Chest: Effort normal. No respiratory distress.  Abdominal: Exhibits no distension.  Musculoskeletal: No obvious deformity or bruising over the left foot.  There is soft tissue swelling of the left foot  without overlying erythema, warmth or induration.  Tenderness along the lateral aspect of the left foot, no calf tenderness Neurological: Alert. Fluent speech.  Full strength in ankle dorsi and plantar flexion.  Sensation to light touch intact in bilateral lower extremities Skin: Skin is warm and dry.  Psychiatric: Normal mood and affect. Behavior is normal.  Nursing note and vitals reviewed.   ED Treatments / Results  Labs (all labs ordered are listed, but only abnormal results are displayed) Labs Reviewed - No data to display  EKG  EKG Interpretation None       Radiology Dg Foot Complete Left  Result  Date: 12/23/2016 CLINICAL DATA:  21 year old female with throbbing pain in the left foot. EXAM: LEFT FOOT - COMPLETE 3+ VIEW COMPARISON:  No priors. FINDINGS: There is no evidence of fracture or dislocation. There is no evidence of arthropathy or other focal bone abnormality. Soft tissues are unremarkable. IMPRESSION: Negative. Electronically Signed   By: Trudie Reedaniel  Entrikin M.D.   On: 12/23/2016 16:52    Procedures Procedures (including critical care time)  Medications Ordered in ED Medications  ibuprofen (ADVIL,MOTRIN) tablet 800 mg (not administered)     Initial Impression / Assessment and Plan / ED Course  I have reviewed the triage vital signs and the nursing notes.  Pertinent labs & imaging results that were available during my care of the patient were reviewed by me and considered in my medical decision making (see chart for details).     Presents with atraumatic pain of the left foot.  No obvious deformities but she does have some mild soft tissue swelling of the dorsum of the foot.  X-rays visualized and does not show evidence of an acute fracture.  Unclear etiology at this time, may be overuse type injury.  Discussed supportive care including leg elevation, compression dressing, anti-inflammatory medications, and icing.  Sports medicine referral given as needed. Strict return and follow-up instructions reviewed. She expressed understanding of all discharge instructions and felt comfortable with the plan of care.   Final Clinical Impressions(s) / ED Diagnoses   Final diagnoses:  Left foot pain    ED Discharge Orders    None       Lavera GuiseLiu, Dana Duo, MD 12/23/16 2001

## 2016-12-23 NOTE — ED Triage Notes (Signed)
C/o pain/swelling to right foot x today-denies injury-NAD-presents to triage in w/c

## 2016-12-23 NOTE — ED Notes (Signed)
ED Provider at bedside. 

## 2016-12-23 NOTE — Discharge Instructions (Addendum)
Your x-ray does not show obvious broken bone or fracture of the foot.  Please treat supportively, with elevation of leg on 2-3 pillow at rest, icing, compression dressing, and ibuprofen and tylenol for pain control.  You can follow-up with Dr. Pearletha ForgeHudnall from sports medicine for follow-up.  Return without fail for worsening symptoms, including fever, escalating pain, worsening swelling, or any other symptoms concerning to you

## 2016-12-24 ENCOUNTER — Emergency Department (HOSPITAL_COMMUNITY)
Admission: EM | Admit: 2016-12-24 | Discharge: 2016-12-24 | Disposition: A | Discharge status: Home / Self Care | Payer: 59 | Attending: Emergency Medicine | Admitting: Emergency Medicine

## 2016-12-24 ENCOUNTER — Encounter (HOSPITAL_COMMUNITY): Payer: Self-pay | Admitting: *Deleted

## 2016-12-24 DIAGNOSIS — R2242 Localized swelling, mass and lump, left lower limb: Secondary | ICD-10-CM | POA: Diagnosis not present

## 2016-12-24 DIAGNOSIS — M25472 Effusion, left ankle: Secondary | ICD-10-CM

## 2016-12-24 DIAGNOSIS — M25572 Pain in left ankle and joints of left foot: Secondary | ICD-10-CM | POA: Insufficient documentation

## 2016-12-24 DIAGNOSIS — F17210 Nicotine dependence, cigarettes, uncomplicated: Secondary | ICD-10-CM | POA: Insufficient documentation

## 2016-12-24 NOTE — Discharge Instructions (Signed)
Please take Ibuprofen (Advil, motrin) and Tylenol (acetaminophen) to relieve your pain.  You may take up to 600 MG (3 pills) of normal strength ibuprofen every 8 hours as needed.  In between doses of ibuprofen you make take tylenol, up to 1,000 mg (two extra strength pills).  Do not take more than 3,000 mg tylenol in a 24 hour period.  Please check all medication labels as many medications such as pain and cold medications may contain tylenol.  Do not drink alcohol while taking these medications.  Do not take other NSAID'S while taking ibuprofen (such as aleve or naproxen).  Please take ibuprofen with food to decrease stomach upset.  Please follow up with sports medicine if your symptoms do not improve in one week.    Seek additional medical care if you develop fevers, redness, if you develop numbness or tingling or feel like your foot is cold.   Please elevate your foot above your heart.

## 2016-12-24 NOTE — ED Provider Notes (Signed)
[facility]GENC[facility] Provider Note   CSN: [id number] Arrival date & time: 12/24/16  1333     History   Chief Complaint Chief Complaint  Patient presents with  . Ankle Pain    HPI Kelly Willis is a 21 y.o. female who presents today for evaluation of left ankle pain and swelling.  She was seen for this yesterday at [facility] where she had x-rays performed that did not show any breaks.  She denies any history of trauma.  She does not have any history of gout.  She was given crutches yesterday for pain control.  She reports that she is still unable to bear weight.  HPI  Past Medical History:  Diagnosis Date  . ADHD (attention deficit hyperactivity disorder)   . Gestational diabetes   . Obesity   . Pilonidal cyst 06/2014    Patient Active Problem List   Diagnosis Date Noted  . Pregnant 01/20/2016  . Trichomonal vaginitis during pregnancy 01/20/2016  . Insufficient prenatal care 01/20/2016  . Positive GBS test 01/19/2016  . GDM (gestational diabetes mellitus) 11/21/2015  . Supervision of normal first pregnancy, antepartum 08/23/2015  . Late prenatal care 08/23/2015  . Pilonidal abscess 10/16/2012    Past Surgical History:  Procedure Laterality Date  . PILONIDAL CYST EXCISION      OB History    Gravida Para Term Preterm AB Living   1 1 1  0 0 1   SAB TAB Ectopic Multiple Live Births   0 0 0 0 1       Home Medications    Prior to Admission medications   Not on File    Family History Family History  Problem Relation Age of Onset  . Hypertension Maternal Grandfather     Social History Social History   Tobacco Use  . Smoking status: Current Every Day Smoker    Years: 2.00    Types: Cigarettes  . Smokeless tobacco: Never Used  . Tobacco comment: 4 cig./day  Substance Use Topics  . Alcohol use: Yes    Comment: occasionally  . Drug use: No     Allergies   Pepto-bismol [bismuth subsalicylate]   Review of  Systems Review of Systems  Constitutional: Negative for chills and fever.  Musculoskeletal: Positive for arthralgias, gait problem and joint swelling.  Skin: Negative for color change, rash and wound.     Physical Exam Updated Vital Signs BP 135/84 (BP Location: Left Arm)   Pulse 91   Temp 98.2 F (36.8 C) (Oral)   Resp 16   Ht 5\' 6"  (1.676 m)   Wt 127 kg (280 lb)   LMP 12/15/2016   SpO2 100%   BMI 45.19 kg/m   Physical Exam  Constitutional: She appears well-developed and well-nourished.  HENT:  Head: Normocephalic and atraumatic.  Cardiovascular:  Left foot is warm and well perfused.  2+ DP/PT pulses.  Musculoskeletal:  No tenderness to palpation, swelling, or redness to left calf. Tenderness palpation over lateral aspect of ankle.  There is swelling anterior to the lateral malleolus.  No fluctuance, induration, drainage. Patient is able to move her left ankle, however states that it increases her pain.  Neurological:  Intact sensation to left foot.  Skin: Skin is warm and dry. She is not diaphoretic.  No obvious breaks in the skin on left foot or ankle.  There is no redness, abnormal warmth, or rash.  Nursing note and vitals reviewed.    ED Treatments / Results  Labs (all labs ordered are listed, but only abnormal results are displayed) Labs Reviewed - No data to display  EKG  EKG Interpretation None       Radiology- obtained yesterday at [place], none obtained this ED visit.  Dg Foot Complete Left  Result Date: 12/23/2016 CLINICAL DATA:  21 year old female with throbbing pain in the left foot. EXAM: LEFT FOOT - COMPLETE 3+ VIEW COMPARISON:  No priors. FINDINGS: There is no evidence of fracture or dislocation. There is no evidence of arthropathy or other focal bone abnormality. Soft tissues are unremarkable. IMPRESSION: Negative. Electronically Signed   By: Trudie Reedaniel  Entrikin M.D.   On: 12/23/2016 16:52    Procedures Procedures (including critical care  time)  Medications Ordered in ED Medications - No data to display   Initial Impression / Assessment and Plan / ED Course  I have reviewed the triage vital signs and the nursing notes.  Pertinent labs & imaging results that were available during my care of the patient were reviewed by me and considered in my medical decision making (see chart for details).    Kelly SangerChaneldra Georgiou presents today for evaluation of continued left ankle pain and swelling.  [facility] and instructed to take ibuprofen, elevate her foot, use an Ace wrap, and follow-up with sports medicine as needed.  X-rays were obtained yesterday without acute osseous abnormalities.  She does not have any history of trauma.  She has not been compliant with ibuprofen, Ace wrap, or elevation.  Repeat x-rays were discussed with the patient, however I feel like this would be low yield and patient refused at this time.  Patient was instructed to be compliant with ibuprofen, elevation, and ice.  She was once again told to follow-up with sports medicine if this does not improve in 1 week.  Causes for her symptoms were considered including infection, she did not have any breaks in the skin, no fevers or chills, the area is not abnormally warm or red, there is not drainage, fluctuance, or induration.  Gout was also considered, however low suspicion based on age, lack of redness.  Patient hemodynamically stable, safe for follow-up outpatient.  Given return precautions and states her understanding.  Final Clinical Impressions(s) / ED Diagnoses   Final diagnoses:  Acute left ankle pain  Ankle swelling, left    ED Discharge Orders    None       Norman ClayHammond, Soila Printup W, PA-C 12/24/16 1435    Jacalyn LefevreHaviland, Julie, MD 12/24/16 1504

## 2016-12-24 NOTE — ED Notes (Signed)
See EDP secondary assessment.  

## 2016-12-24 NOTE — ED Triage Notes (Signed)
Pt  In c/o right foot/ankle pain that started two days ago, denies injury, seen [facility] for same and had xray completed

## 2017-01-16 ENCOUNTER — Emergency Department (HOSPITAL_COMMUNITY)
Admission: EM | Admit: 2017-01-16 | Discharge: 2017-01-16 | Disposition: A | Discharge status: Home / Self Care | Payer: 59 | Attending: Emergency Medicine | Admitting: Emergency Medicine

## 2017-01-16 ENCOUNTER — Encounter (HOSPITAL_COMMUNITY): Payer: Self-pay | Admitting: Emergency Medicine

## 2017-01-16 DIAGNOSIS — F17210 Nicotine dependence, cigarettes, uncomplicated: Secondary | ICD-10-CM | POA: Diagnosis not present

## 2017-01-16 DIAGNOSIS — F909 Attention-deficit hyperactivity disorder, unspecified type: Secondary | ICD-10-CM | POA: Insufficient documentation

## 2017-01-16 DIAGNOSIS — R519 Headache, unspecified: Secondary | ICD-10-CM

## 2017-01-16 DIAGNOSIS — R51 Headache: Secondary | ICD-10-CM | POA: Diagnosis not present

## 2017-01-16 DIAGNOSIS — Z8632 Personal history of gestational diabetes: Secondary | ICD-10-CM | POA: Diagnosis not present

## 2017-01-16 LAB — CBC WITH DIFFERENTIAL/PLATELET
BASOS PCT: 0 %
Basophils Absolute: 0 10*3/uL (ref 0.0–0.1)
Eosinophils Absolute: 0.1 10*3/uL (ref 0.0–0.7)
Eosinophils Relative: 1 %
HEMATOCRIT: 37 % (ref 36.0–46.0)
HEMOGLOBIN: 11.7 g/dL — AB (ref 12.0–15.0)
LYMPHS ABS: 1.4 10*3/uL (ref 0.7–4.0)
Lymphocytes Relative: 21 %
MCH: 26 pg (ref 26.0–34.0)
MCHC: 31.6 g/dL (ref 30.0–36.0)
MCV: 82.2 fL (ref 78.0–100.0)
MONOS PCT: 5 %
Monocytes Absolute: 0.3 10*3/uL (ref 0.1–1.0)
NEUTROS ABS: 5.1 10*3/uL (ref 1.7–7.7)
NEUTROS PCT: 73 %
Platelets: 288 10*3/uL (ref 150–400)
RBC: 4.5 MIL/uL (ref 3.87–5.11)
RDW: 13.5 % (ref 11.5–15.5)
WBC: 6.9 10*3/uL (ref 4.0–10.5)

## 2017-01-16 LAB — COMPREHENSIVE METABOLIC PANEL
ALBUMIN: 4 g/dL (ref 3.5–5.0)
ALK PHOS: 97 U/L (ref 38–126)
ALT: 12 U/L — ABNORMAL LOW (ref 14–54)
ANION GAP: 8 (ref 5–15)
AST: 23 U/L (ref 15–41)
BILIRUBIN TOTAL: 0.8 mg/dL (ref 0.3–1.2)
BUN: 8 mg/dL (ref 6–20)
CALCIUM: 9.3 mg/dL (ref 8.9–10.3)
CO2: 27 mmol/L (ref 22–32)
CREATININE: 0.7 mg/dL (ref 0.44–1.00)
Chloride: 102 mmol/L (ref 101–111)
GFR calc Af Amer: >60 mL/min (ref >60)
GFR calc non Af Amer: >60 mL/min (ref >60)
GLUCOSE: 106 mg/dL — AB (ref 65–99)
Potassium: 3.9 mmol/L (ref 3.5–5.1)
Sodium: 137 mmol/L (ref 135–145)
TOTAL PROTEIN: 8.4 g/dL — AB (ref 6.5–8.1)

## 2017-01-16 LAB — I-STAT BETA HCG BLOOD, ED (MC, WL, AP ONLY): I-stat hCG, quantitative: <5 m[IU]/mL (ref <5)

## 2017-01-16 MED ORDER — KETOROLAC TROMETHAMINE 30 MG/ML IJ SOLN
15.0000 mg | Freq: Once | INTRAMUSCULAR | Status: AC
  Administered 2017-01-16: 15 mg via INTRAVENOUS
  Filled 2017-01-16: qty 1

## 2017-01-16 MED ORDER — PROMETHAZINE HCL 25 MG PO TABS: 25.0000 mg | ORAL_TABLET | Freq: Three times a day (TID) | ORAL | 0 refills | Status: DC | PRN

## 2017-01-16 MED ORDER — SODIUM CHLORIDE 0.9 % IV BOLUS (SEPSIS)
1000.0000 mL | Freq: Once | INTRAVENOUS | Status: AC
  Administered 2017-01-16: 1000 mL via INTRAVENOUS

## 2017-01-16 MED ORDER — METOCLOPRAMIDE HCL 5 MG/ML IJ SOLN
10.0000 mg | Freq: Once | INTRAMUSCULAR | Status: AC
  Administered 2017-01-16: 10 mg via INTRAVENOUS
  Filled 2017-01-16: qty 2

## 2017-01-16 NOTE — ED Triage Notes (Signed)
Patient here via EMS with complaints of headache that started this am. 1 episode of vomiting. No hx of migraines.

## 2017-01-16 NOTE — ED Provider Notes (Signed)
[facility]-EMERGENCY DEPT Provider Note   CSN: [id number] Arrival date & time: 01/16/17  1821     History   Chief Complaint Chief Complaint  Patient presents with  . Headache  . Emesis    HPI Kelly SangerChaneldra Andreasen is a 21 y.o. female.  14   The history is provided by the patient. No language interpreter was used.  Headache   Associated symptoms include vomiting.  Emesis   Associated symptoms include headaches.    Kelly Willis is a 21 y.o. female who presents to the Emergency Department complaining of HA.  She reports bitemporal headache that started upon waking this morning.  Yesterday she was in her routine state of health.  Headache is sharp in nature and waxing and waning throughout the day.  She has associated nausea and vomiting.  No history of similar symptoms.  She denies any fevers, chest pain, abdominal pain, diarrhea, dysuria.  She has no medical problems and takes no medications.  No known sick contacts.  Past Medical History:  Diagnosis Date  . ADHD (attention deficit hyperactivity disorder)   . Gestational diabetes   . Obesity   . Pilonidal cyst 06/2014    Patient Active Problem List   Diagnosis Date Noted  . Pregnant 01/20/2016  . Trichomonal vaginitis during pregnancy 01/20/2016  . Insufficient prenatal care 01/20/2016  . Positive GBS test 01/19/2016  . GDM (gestational diabetes mellitus) 11/21/2015  . Supervision of normal first pregnancy, antepartum 08/23/2015  . Late prenatal care 08/23/2015  . Pilonidal abscess 10/16/2012    Past Surgical History:  Procedure Laterality Date  . PILONIDAL CYST EXCISION    . PILONIDAL CYST EXCISION N/A 07/08/2014   Procedure: CYST EXCISION PILONIDAL ;  Surgeon: Avel Peaceodd Rosenbower, MD;  Location: [facility];  Service: General;  Laterality: N/A;    OB History    Gravida Para Term Preterm AB Living   1 1 1  0 0 1   SAB TAB Ectopic Multiple Live Births   0 0 0 0 1       Home  Medications    Prior to Admission medications   Medication Sig Start Date End Date Taking? Authorizing Provider  promethazine (PHENERGAN) 25 MG tablet Take 1 tablet (25 mg total) by mouth every 8 (eight) hours as needed for nausea or vomiting. 01/16/17   Tilden Fossaees, Haylen Bellotti, MD    Family History Family History  Problem Relation Age of Onset  . Hypertension Maternal Grandfather     Social History Social History   Tobacco Use  . Smoking status: Current Every Day Smoker    Years: 2.00    Types: Cigarettes  . Smokeless tobacco: Never Used  . Tobacco comment: 4 cig./day  Substance Use Topics  . Alcohol use: Yes    Comment: occasionally  . Drug use: No     Allergies   Pepto-bismol [bismuth subsalicylate]   Review of Systems Review of Systems  Gastrointestinal: Positive for vomiting.  Neurological: Positive for headaches.  All other systems reviewed and are negative.    Physical Exam Updated Vital Signs BP (!) 141/77 (BP Location: Right Arm)   Pulse 87   Temp 98.1 F (36.7 C) (Oral)   Resp 14   SpO2 100%   Physical Exam  Constitutional: She is oriented to person, place, and time. She appears well-developed and well-nourished.  Non-toxic appearance. She does not appear ill.  HENT:  Head: Normocephalic and atraumatic.  Eyes: EOM are normal. Pupils are equal, round,  and reactive to light. Right eye exhibits no nystagmus.  Neck: Neck supple.  Cardiovascular: Normal rate and regular rhythm.  No murmur heard. Pulmonary/Chest: Effort normal and breath sounds normal. No respiratory distress.  Abdominal: Soft. There is no tenderness. There is no rebound and no guarding.  Musculoskeletal: She exhibits no edema or tenderness.  Neurological: She is alert and oriented to person, place, and time. She has normal strength.  Skin: Skin is warm and dry.  Psychiatric: She has a normal mood and affect. Her behavior is normal.  Nursing note and vitals reviewed.    ED Treatments /  Results  Labs (all labs ordered are listed, but only abnormal results are displayed) Labs Reviewed  CBC WITH DIFFERENTIAL/PLATELET - Abnormal; Notable for the following components:      Result Value   Hemoglobin 11.7 (*)    All other components within normal limits  COMPREHENSIVE METABOLIC PANEL - Abnormal; Notable for the following components:   Glucose, Bld 106 (*)    Total Protein 8.4 (*)    ALT 12 (*)    All other components within normal limits  I-STAT BETA HCG BLOOD, ED ([facility], [facility], [facility] ONLY)    EKG  EKG Interpretation None       Radiology No results found.  Procedures Procedures (including critical care time)  Medications Ordered in ED Medications  sodium chloride 0.9 % bolus 1,000 mL (0 mLs Intravenous Stopped 01/16/17 2146)  metoCLOPramide (REGLAN) injection 10 mg (10 mg Intravenous Given 01/16/17 1959)  ketorolac (TORADOL) 30 MG/ML injection 15 mg (15 mg Intravenous Given 01/16/17 2153)     Initial Impression / Assessment and Plan / ED Course  I have reviewed the triage vital signs and the nursing notes.  Pertinent labs & imaging results that were available during my care of the patient were reviewed by me and considered in my medical decision making (see chart for details).     Patient presents for evaluation of headache with vomiting.  She is nontoxic appearing patient with no focal neurologic deficits.  Presentation is not consistent with meningitis, dural sinus thrombosis, subarachnoid hemorrhage.  Following treatment in the department her symptoms are significantly improved.  Discussed with patient home care for headache, N/V.  Discussed outpatient follow-up and return precautions.  Final Clinical Impressions(s) / ED Diagnoses   Final diagnoses:  Bad headache    ED Discharge Orders        Ordered    promethazine (PHENERGAN) 25 MG tablet  Every 8 hours PRN     01/16/17 2121       Tilden Fossaees, Gianny Killman, MD 01/17/17 [phone]

## 2017-02-11 NOTE — L&D Delivery Note (Signed)
Called to the patient's room after she had already delivered. On arrival infant with skin to skin with the mother. Cord had been clamped and cut by nursing staff. Placenta was still undelivered.   Delivery Note At 6:12 PM a viable female was delivered via Vaginal, Spontaneous (Presentation: ;  ).  APGAR: 9, 9; weight  .   Placenta status: spontaneous , complete/intact .  Cord: 3V  with the following complications: unattended by provider due to precipitous nature of delivery .  Cord pH: NA  Anesthesia:  None Episiotomy: None Lacerations: None Suture Repair: NA Est. Blood Loss (mL): 57  Mom to postpartum.  Baby to Couplet care / Skin to Skin.  Thressa ShellerHeather Hogan 01/03/2018, 6:29 PM

## 2017-07-16 ENCOUNTER — Encounter: Payer: Self-pay | Admitting: *Deleted

## 2017-07-16 ENCOUNTER — Ambulatory Visit (INDEPENDENT_AMBULATORY_CARE_PROVIDER_SITE_OTHER): Payer: 59 | Admitting: Obstetrics and Gynecology

## 2017-07-16 ENCOUNTER — Other Ambulatory Visit (HOSPITAL_COMMUNITY)
Admission: RE | Admit: 2017-07-16 | Discharge: 2017-07-16 | Disposition: A | Discharge status: Home / Self Care | Payer: 59 | Source: Ambulatory Visit | Attending: Obstetrics and Gynecology | Admitting: Obstetrics and Gynecology

## 2017-07-16 ENCOUNTER — Encounter: Payer: Self-pay | Admitting: Obstetrics and Gynecology

## 2017-07-16 VITALS — BP 131/79 | HR 97 | Wt 292.4 lb

## 2017-07-16 DIAGNOSIS — O0992 Supervision of high risk pregnancy, unspecified, second trimester: Secondary | ICD-10-CM | POA: Diagnosis present

## 2017-07-16 DIAGNOSIS — O361990 Maternal care for other isoimmunization, unspecified trimester, not applicable or unspecified: Secondary | ICD-10-CM

## 2017-07-16 DIAGNOSIS — Z8632 Personal history of gestational diabetes: Secondary | ICD-10-CM | POA: Diagnosis not present

## 2017-07-16 DIAGNOSIS — O09292 Supervision of pregnancy with other poor reproductive or obstetric history, second trimester: Secondary | ICD-10-CM

## 2017-07-16 DIAGNOSIS — O361920 Maternal care for other isoimmunization, second trimester, not applicable or unspecified: Secondary | ICD-10-CM | POA: Diagnosis not present

## 2017-07-16 DIAGNOSIS — Z3490 Encounter for supervision of normal pregnancy, unspecified, unspecified trimester: Secondary | ICD-10-CM | POA: Insufficient documentation

## 2017-07-16 LAB — POCT URINALYSIS DIP (DEVICE)
Bilirubin Urine: NEGATIVE
Glucose, UA: NEGATIVE mg/dL
Hgb urine dipstick: NEGATIVE
Ketones, ur: 40 mg/dL — AB
NITRITE: NEGATIVE
PH: 6.5 (ref 5.0–8.0)
Protein, ur: NEGATIVE mg/dL
Specific Gravity, Urine: >=1.03 (ref 1.005–1.030)
UROBILINOGEN UA: 0.2 mg/dL (ref 0.0–1.0)

## 2017-07-16 MED ORDER — PRENATAL 27-1 MG PO TABS: 1.0000 | ORAL_TABLET | Freq: Every day | ORAL | 9 refills | Status: DC

## 2017-07-16 NOTE — Progress Notes (Signed)
INITIAL PRENATAL VISIT NOTE  Subjective:  Kelly Willis is a 22 y.o. G2P1001 at 6514w2d by LMP being seen today for her initial prenatal visit. [facility] that put her EDD at 01/10/2018 with LMP giving EDD 01/12/18. Will keep EDD by LMP for now and obtain records from prior OB/GYN.   This is a planned pregnancy. She and partner are happy with the pregnancy. 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 She has a medical history significant for pilonidal cysts, ADHD.  Patient reports occasional vaginal pressure.  Contractions: Not present. Vag. Bleeding: None.  Movement: Absent. Denies leaking of fluid.   Quit smoking cigarettes when she found out she was pregnant.  Past Medical History:  Diagnosis Date  . ADHD (attention deficit hyperactivity disorder)   . Gestational diabetes   . Obesity   . Pilonidal cyst 06/2014    Past Surgical History:  Procedure Laterality Date  . PILONIDAL CYST EXCISION    . PILONIDAL CYST EXCISION N/A 07/08/2014   Procedure: CYST EXCISION PILONIDAL ;  Surgeon: Avel Peaceodd Rosenbower, MD;  Location: [facility];  Service: General;  Laterality: N/A;    OB History  Gravida Para Term Preterm AB Living  2 1 1  0 0 1  SAB TAB Ectopic Multiple Live Births  0 0 0 0 1    # Outcome Date GA Lbr Len/2nd Weight Sex Delivery Anes PTL Lv  2 Current           1 Term 01/21/16 8541w1d 11:38 / 00:09 6 lb 15.8 oz (3.17 kg) F Vag-Spont EPI  LIV     Birth Comments: GDM    Social History   Socioeconomic History  . Marital status: Single    Spouse name: Not on file  . Number of children: Not on file  . Years of education: Not on file  . Highest education level: Not on file  Occupational History  . Not on file  Social Needs  . Financial resource strain: Not on file  . Food insecurity:    Worry: Not on file    Inability: Not on file  . Transportation needs:    Medical: Not on file    Non-medical: Not on file  Tobacco  Use  . Smoking status: Former Smoker    Years: 2.00    Types: Cigarettes    Last attempt to quit: 04/15/2017    Years since quitting: 0.2  . Smokeless tobacco: Never Used  . Tobacco comment: 4 cig./day  Substance and Sexual Activity  . Alcohol use: Yes    Comment: occasionally  . Drug use: No  . Sexual activity: Yes    Partners: Male    Birth control/protection: None  Lifestyle  . Physical activity:    Days per week: Not on file    Minutes per session: Not on file  . Stress: Not on file  Relationships  . Social connections:    Talks on phone: Not on file    Gets together: Not on file    Attends religious service: Not on file    Active member of club or organization: Not on file    Attends meetings of clubs or organizations: Not on file    Relationship status: Not on file  Other Topics Concern  . Not on file  Social History Narrative  . Not on file    Family History  Problem Relation Age of Onset  . Hypertension Maternal Grandfather     (  Not in a hospital admission)  Allergies  Allergen Reactions  . Pepto-Bismol [Bismuth Subsalicylate] Nausea And Vomiting   Review of Systems: Negative except for what is mentioned in HPI.  Objective:   Vitals:   07/16/17 1504  BP: 131/79  Pulse: 97  Weight: 292 lb 6.4 oz (132.6 kg)    Fetal Status: Fetal Heart Rate (bpm): 155   Movement: Absent     Physical Exam: BP 131/79   Pulse 97   Wt 292 lb 6.4 oz (132.6 kg)   LMP 04/07/2017 (Approximate)   BMI 47.19 kg/m  CONSTITUTIONAL: Well-developed, well-nourished female in no acute distress.  NEUROLOGIC: Alert and oriented to person, place, and time. Normal reflexes, muscle tone coordination. No cranial nerve deficit noted. PSYCHIATRIC: Normal mood and affect. Normal behavior. Normal judgment and thought content. SKIN: Skin is warm and dry. No rash noted. Not diaphoretic. No erythema. No pallor. HENT:  Normocephalic, atraumatic, External right and left ear normal. Oropharynx  is clear and moist EYES: Conjunctivae and EOM are normal. Pupils are equal, round, and reactive to light. No scleral icterus.  NECK: Normal range of motion, supple, no masses CARDIOVASCULAR: Normal heart rate noted, regular rhythm RESPIRATORY: Effort and breath sounds normal, no problems with respiration noted BREASTS: deferred ABDOMEN: Soft, nontender, nondistended GU: normal appearing external female genitalia, multiparous normal appearing cervix, scant white discharge in vagina, no lesions noted Bimanual: 14 weeks sized uterus, no adnexal tenderness or palpable lesions noted MUSCULOSKELETAL: Normal range of motion. EXT:  No edema and no tenderness. 2+ distal pulses.   Assessment and Plan:  Pregnancy: G2P1001 at [redacted]w[redacted]d by LMP, however patient had [place] at [facility] where they gave her EDD of 01/12/18, unclear why it would be changed from LMP if <7 days, will obtain US to clarify.  1. Supervision of high risk pregnancy, antepartum, second trimester - Culture, OB Urine - GC/Chlamydia probe amp ()not at [facility] - Obstetric Panel, Including HIV - [place] MFM OB DETAIL +14 WK; Future - SMN1 COPY NUMBER ANALYSIS (SMA Carrier Screen) - CHL AMB BABYSCRIPTS OPT IN  2. H/O gestational diabetes in prior pregnancy, currently pregnant, second trimester Will schedule for 2 hr GTT - Hemoglobin A1c   Preterm labor symptoms and general obstetric precautions including but not limited to vaginal bleeding, contractions, leaking of fluid and fetal movement were reviewed in detail with the patient.  Please refer to After Visit Summary for other counseling recommendations.   Return in about 1 month (around 08/13/2017) for OB visit.  Conan Bowens 07/16/2017 5:28 PM

## 2017-07-16 NOTE — Progress Notes (Signed)
Here for initial prenatal visit. [facility] went to [facility].  States they did an ultrasound , no bloodwork. ROI signed for us results.  Sent text to sign up for MyChart. Also signed up for Babyscripts app. PMH completed.

## 2017-07-17 LAB — GC/CHLAMYDIA PROBE AMP (~~LOC~~) NOT AT ARMC
Chlamydia: NEGATIVE
Neisseria Gonorrhea: NEGATIVE

## 2017-07-18 LAB — URINE CULTURE, OB REFLEX

## 2017-07-18 LAB — CULTURE, OB URINE

## 2017-07-22 ENCOUNTER — Other Ambulatory Visit: Payer: Self-pay

## 2017-07-22 DIAGNOSIS — Z3490 Encounter for supervision of normal pregnancy, unspecified, unspecified trimester: Secondary | ICD-10-CM

## 2017-07-22 LAB — OBSTETRIC PANEL, INCLUDING HIV
BASOS ABS: 0 10*3/uL (ref 0.0–0.2)
Basos: 0 %
EOS (ABSOLUTE): 0.1 10*3/uL (ref 0.0–0.4)
Eos: 1 %
HIV SCREEN 4TH GENERATION: NONREACTIVE
Hematocrit: 31 % — ABNORMAL LOW (ref 34.0–46.6)
Hemoglobin: 10.1 g/dL — ABNORMAL LOW (ref 11.1–15.9)
Hepatitis B Surface Ag: NEGATIVE
IMMATURE GRANS (ABS): 0 10*3/uL (ref 0.0–0.1)
Immature Granulocytes: 0 %
LYMPHS: 31 %
Lymphocytes Absolute: 2.4 10*3/uL (ref 0.7–3.1)
MCH: 27.1 pg (ref 26.6–33.0)
MCHC: 32.6 g/dL (ref 31.5–35.7)
MCV: 83 fL (ref 79–97)
MONOCYTES: 5 %
MONOS ABS: 0.4 10*3/uL (ref 0.1–0.9)
NEUTROS ABS: 4.9 10*3/uL (ref 1.4–7.0)
Neutrophils: 63 %
PLATELETS: 302 10*3/uL (ref 150–450)
RBC: 3.73 x10E6/uL — ABNORMAL LOW (ref 3.77–5.28)
RDW: 13.7 % (ref 12.3–15.4)
RPR Ser Ql: NONREACTIVE
RUBELLA: 3.37 {index} (ref >0.99)
Rh Factor: POSITIVE
WBC: 7.7 10*3/uL (ref 3.4–10.8)

## 2017-07-22 LAB — HEMOGLOBIN A1C
ESTIMATED AVERAGE GLUCOSE: 82 mg/dL
HEMOGLOBIN A1C: 4.5 % — AB (ref 4.8–5.6)

## 2017-07-22 LAB — SMN1 COPY NUMBER ANALYSIS (SMA CARRIER SCREENING)

## 2017-07-22 LAB — AB SCR+ANTIBODY ID: Antibody Screen: POSITIVE — AB

## 2017-07-23 ENCOUNTER — Other Ambulatory Visit: Payer: 59

## 2017-07-23 DIAGNOSIS — O361990 Maternal care for other isoimmunization, unspecified trimester, not applicable or unspecified: Secondary | ICD-10-CM | POA: Insufficient documentation

## 2017-07-23 DIAGNOSIS — O361910 Maternal care for other isoimmunization, first trimester, not applicable or unspecified: Secondary | ICD-10-CM | POA: Insufficient documentation

## 2017-08-06 ENCOUNTER — Encounter (HOSPITAL_COMMUNITY): Payer: Self-pay

## 2017-08-13 ENCOUNTER — Encounter: Payer: 59 | Admitting: Advanced Practice Midwife

## 2017-08-13 ENCOUNTER — Ambulatory Visit (HOSPITAL_COMMUNITY): Payer: 59

## 2017-08-20 ENCOUNTER — Other Ambulatory Visit: Payer: Self-pay | Admitting: Obstetrics and Gynecology

## 2017-08-20 ENCOUNTER — Ambulatory Visit (HOSPITAL_COMMUNITY)
Admission: RE | Admit: 2017-08-20 | Discharge: 2017-08-20 | Disposition: A | Discharge status: Home / Self Care | Payer: 59 | Source: Ambulatory Visit | Attending: Obstetrics and Gynecology | Admitting: Obstetrics and Gynecology

## 2017-08-20 DIAGNOSIS — Z3689 Encounter for other specified antenatal screening: Secondary | ICD-10-CM

## 2017-08-20 DIAGNOSIS — O0992 Supervision of high risk pregnancy, unspecified, second trimester: Secondary | ICD-10-CM | POA: Insufficient documentation

## 2017-08-20 DIAGNOSIS — Z363 Encounter for antenatal screening for malformations: Secondary | ICD-10-CM | POA: Diagnosis not present

## 2017-08-20 DIAGNOSIS — O99212 Obesity complicating pregnancy, second trimester: Secondary | ICD-10-CM

## 2017-08-20 DIAGNOSIS — Z3A19 19 weeks gestation of pregnancy: Secondary | ICD-10-CM

## 2017-08-27 ENCOUNTER — Other Ambulatory Visit: Payer: 59

## 2017-08-27 ENCOUNTER — Other Ambulatory Visit: Payer: Self-pay | Admitting: *Deleted

## 2017-08-27 DIAGNOSIS — O09292 Supervision of pregnancy with other poor reproductive or obstetric history, second trimester: Secondary | ICD-10-CM

## 2017-08-27 DIAGNOSIS — Z8632 Personal history of gestational diabetes: Principal | ICD-10-CM

## 2017-08-27 DIAGNOSIS — Z3490 Encounter for supervision of normal pregnancy, unspecified, unspecified trimester: Secondary | ICD-10-CM

## 2017-09-04 ENCOUNTER — Encounter: Payer: 59 | Admitting: Student

## 2017-10-20 ENCOUNTER — Ambulatory Visit (INDEPENDENT_AMBULATORY_CARE_PROVIDER_SITE_OTHER): Payer: 59 | Admitting: Family Medicine

## 2017-10-20 VITALS — BP 112/72 | HR 98 | Wt 283.5 lb

## 2017-10-20 DIAGNOSIS — O0933 Supervision of pregnancy with insufficient antenatal care, third trimester: Secondary | ICD-10-CM

## 2017-10-20 DIAGNOSIS — H538 Other visual disturbances: Secondary | ICD-10-CM

## 2017-10-20 DIAGNOSIS — R634 Abnormal weight loss: Secondary | ICD-10-CM | POA: Insufficient documentation

## 2017-10-20 DIAGNOSIS — Z23 Encounter for immunization: Secondary | ICD-10-CM

## 2017-10-20 DIAGNOSIS — N649 Disorder of breast, unspecified: Secondary | ICD-10-CM | POA: Insufficient documentation

## 2017-10-20 DIAGNOSIS — Z3490 Encounter for supervision of normal pregnancy, unspecified, unspecified trimester: Secondary | ICD-10-CM

## 2017-10-20 DIAGNOSIS — O361990 Maternal care for other isoimmunization, unspecified trimester, not applicable or unspecified: Secondary | ICD-10-CM

## 2017-10-20 DIAGNOSIS — O0930 Supervision of pregnancy with insufficient antenatal care, unspecified trimester: Secondary | ICD-10-CM | POA: Insufficient documentation

## 2017-10-20 DIAGNOSIS — O361930 Maternal care for other isoimmunization, third trimester, not applicable or unspecified: Secondary | ICD-10-CM | POA: Diagnosis not present

## 2017-10-20 DIAGNOSIS — R519 Headache, unspecified: Secondary | ICD-10-CM

## 2017-10-20 DIAGNOSIS — R51 Headache: Secondary | ICD-10-CM

## 2017-10-20 DIAGNOSIS — L988 Other specified disorders of the skin and subcutaneous tissue: Secondary | ICD-10-CM | POA: Insufficient documentation

## 2017-10-20 DIAGNOSIS — Z8632 Personal history of gestational diabetes: Secondary | ICD-10-CM | POA: Insufficient documentation

## 2017-10-20 NOTE — Patient Instructions (Signed)
   You will receive a call to schedule your follow-up ultrasound.   We will be in touch when your results return.

## 2017-10-20 NOTE — Progress Notes (Signed)
Pt reports having sores on her breasts and vaginal/pelvic pain for 1 month.  She has been having nausea and vomiting episodes about once per week. Weight loss of 9 lb noted since last visit 3 months ago.  She states she has not been able to come to office for prenatal care due to transportation difficulties.

## 2017-10-20 NOTE — Progress Notes (Signed)
PRENATAL VISIT NOTE Subjective:  Kelly Willis is a 22 y.o. G2P1001 at [redacted]w[redacted]d being seen today for ongoing prenatal care.  She is currently monitored for the following issues for this low-risk pregnancy and has Pilonidal abscess; Pregnant; Trichomonal vaginitis during pregnancy; Insufficient prenatal care; Supervision of low-risk pregnancy; H/O gestational diabetes in prior pregnancy, currently pregnant, second trimester; Anti-M isoimmunization affecting pregnancy, antepartum; and History of gestational diabetes mellitus on their problem list.  Patient reports headaches, blurry vision, pelvic pain, breast sores.  Contractions: Irregular. Vag. Bleeding: None.  Movement: Present. Denies leaking of fluid.   Vaginal Pain - didn't have with first pregnancy - feels heaviness - comes/goes  Breast Sores - history of small abscesses under arms and occasionally in groin - has had pilonidal abscess before - no fevers or chills - usually in between her breasts, medial aspect - get a little swollen then she pops them - tries to keep clean and dry   Pregnancy - trouble making appts because of transportation - weight loss 9lbs since June, N/V once a week - only a snack, 1/2 sandwich and chips for lunch, rameno noodles before bed, usually eating more protein   The following portions of the patient's history were reviewed and updated as appropriate: allergies, current medications, past family history, past medical history, past social history, past surgical history and problem list. Problem list updated.  Objective:   Vitals:   10/20/17 1720  BP: 112/72  Pulse: 98  Weight: 283 lb 8 oz (128.6 kg)   Fetal Status:Movement: Present     General:  Alert, oriented and cooperative. Patient is in no acute distress.  Skin: Skin is warm and dry. No rash noted.   Cardiovascular: Normal heart rate noted  Respiratory: Normal respiratory effort, no problems with respiration noted  Abdomen: Soft, gravid,  appropriate for gestational age.  Pain/Pressure: Present     Pelvic: Cervical exam deferred        Extremities: Normal range of motion.     Mental Status: Normal mood and affect. Normal behavior. Normal judgment and thought content.   Assessment and Plan:  Pregnancy: G2P1001 at [redacted]w[redacted]d.   1. Supervision of Pregnancy - Reviewed prenatal record. Has not been seen since first prenatal visit. Labs reviewed. -- CBC, RPR, HIV obtained today -- will return this week for 2-hr GTT, counseled on importance with history of GDM -- follow-up growth U/S given weight loss, obesity and difficulty assessing fundal height  -- Tdap, flu vaccines administered today -- f/u with social work given transportation difficulties   2. Weight Loss  Occasional Nausea and Vomiting: Ordered TSH though more likely related to poor diet as well as obesity prior to pregnancy.  -- follow-up with nutrition   3. Normocytic Anemia: Present on initial prenatal labs, has continued fatigue. Poor dietary intake. Reports taking gummy PNV. Will start iron pending lab results. HgB electrophoresis normal in 2017. Pending lab results, will consider further work-up with B12, folate, etc prn.   4. Headaches, Blurry Vision: Normal BP today. Will obtain UPC and baseline HELLP labs, as she does have risk factors. I feel that it is past the the window of benefit for ASA prophylaxis though she would be a candidate in the future.   5. Breast Sores: Most consistent with spectrum of hidradenitis. No infected-appearing lesions on exam today. Recommended warm compresses, triple antibiotic ointment and trying to keep dry. Recommended avoiding excessive manipulation of areas because of risk of infection.   Preterm labor symptoms and  general obstetric precautions including but not limited to vaginal bleeding, contractions, leaking of fluid and fetal movement were reviewed in detail with the patient. Please refer to After Visit Summary for other counseling  recommendations.   Future Appointments  Date Time Provider Department Center  10/22/2017  9:10 AM [facility] LAB [facility]  11/03/2017  3:15 PM Burleson, Brand Males, NP [facility] WOC   Cristal Deer. Earlene Plater, DO OB/GYN Fellow

## 2017-10-21 LAB — CBC
HEMOGLOBIN: 9.7 g/dL — AB (ref 11.1–15.9)
Hematocrit: 30.5 % — ABNORMAL LOW (ref 34.0–46.6)
MCH: 27 pg (ref 26.6–33.0)
MCHC: 31.8 g/dL (ref 31.5–35.7)
MCV: 85 fL (ref 79–97)
PLATELETS: 301 10*3/uL (ref 150–450)
RBC: 3.59 x10E6/uL — AB (ref 3.77–5.28)
RDW: 11.7 % — ABNORMAL LOW (ref 12.3–15.4)
WBC: 9.6 10*3/uL (ref 3.4–10.8)

## 2017-10-21 LAB — COMPREHENSIVE METABOLIC PANEL
A/G RATIO: 1.4 (ref 1.2–2.2)
ALK PHOS: 128 IU/L — AB (ref 39–117)
ALT: 14 IU/L (ref 0–32)
AST: 14 IU/L (ref 0–40)
Albumin: 3.9 g/dL (ref 3.5–5.5)
BUN/Creatinine Ratio: 11 (ref 9–23)
BUN: 7 mg/dL (ref 6–20)
Bilirubin Total: 0.2 mg/dL (ref 0.0–1.2)
CALCIUM: 9.5 mg/dL (ref 8.7–10.2)
CO2: 21 mmol/L (ref 20–29)
Chloride: 103 mmol/L (ref 96–106)
Creatinine, Ser: 0.61 mg/dL (ref 0.57–1.00)
GFR calc Af Amer: 148 mL/min/{1.73_m2} (ref >59)
GFR calc non Af Amer: 128 mL/min/{1.73_m2} (ref >59)
GLOBULIN, TOTAL: 2.7 g/dL (ref 1.5–4.5)
Glucose: 88 mg/dL (ref 65–99)
POTASSIUM: 4.5 mmol/L (ref 3.5–5.2)
SODIUM: 138 mmol/L (ref 134–144)
Total Protein: 6.6 g/dL (ref 6.0–8.5)

## 2017-10-21 LAB — TSH: TSH: 1.06 u[IU]/mL (ref 0.450–4.500)

## 2017-10-21 LAB — RPR: RPR Ser Ql: NONREACTIVE

## 2017-10-21 LAB — HIV ANTIBODY (ROUTINE TESTING W REFLEX): HIV SCREEN 4TH GENERATION: NONREACTIVE

## 2017-10-21 LAB — PROTEIN / CREATININE RATIO, URINE
Creatinine, Urine: 228.7 mg/dL
PROTEIN UR: 31.2 mg/dL
PROTEIN/CREAT RATIO: 136 mg/g{creat} (ref 0–200)

## 2017-10-22 ENCOUNTER — Other Ambulatory Visit: Payer: 59

## 2017-10-23 ENCOUNTER — Telehealth: Payer: Self-pay | Admitting: Family Medicine

## 2017-10-23 ENCOUNTER — Telehealth: Payer: Self-pay | Admitting: Clinical

## 2017-10-23 ENCOUNTER — Other Ambulatory Visit: Payer: Self-pay | Admitting: Family Medicine

## 2017-10-23 ENCOUNTER — Encounter: Payer: Self-pay | Admitting: Family Medicine

## 2017-10-23 DIAGNOSIS — D509 Iron deficiency anemia, unspecified: Secondary | ICD-10-CM | POA: Insufficient documentation

## 2017-10-23 DIAGNOSIS — D649 Anemia, unspecified: Secondary | ICD-10-CM

## 2017-10-23 MED ORDER — FERROUS SULFATE 325 (65 FE) MG PO TABS: 325.0000 mg | ORAL_TABLET | Freq: Two times a day (BID) | ORAL | 3 refills | Status: DC

## 2017-10-23 NOTE — Telephone Encounter (Signed)
Called patient regarding lab results. Encouraged to start iron twice a day. She has continued to have headaches. Encouraged her to take Tylenol, drink lots of water, and try a little bit of caffeine. Encouraged to come back to clinic if she is still having headaches, as we would want to recheck her BP. She was unable to come for GTT yesterday because she has no ride. She hopes to be able to come next week.

## 2017-10-23 NOTE — Telephone Encounter (Signed)
Follow up call, referred by Rhett BannisterLaurel Wallace, DO. Pt says she has pregnancy Medicaid, was not aware of Medicaid transportation for medical appointments, nor that she may qualify for reduced-fare bus ID. Pt is nervous about riding a city bus, but [facility], where we will problem-solve transportation to future appointments.

## 2017-11-02 ENCOUNTER — Inpatient Hospital Stay (HOSPITAL_COMMUNITY)
Admission: AD | Admit: 2017-11-02 | Discharge: 2017-11-02 | Disposition: A | Discharge status: Home / Self Care | Payer: 59 | Source: Ambulatory Visit | Attending: Obstetrics and Gynecology | Admitting: Obstetrics and Gynecology

## 2017-11-02 ENCOUNTER — Encounter (HOSPITAL_COMMUNITY): Payer: Self-pay | Admitting: *Deleted

## 2017-11-02 DIAGNOSIS — Z87891 Personal history of nicotine dependence: Secondary | ICD-10-CM | POA: Insufficient documentation

## 2017-11-02 DIAGNOSIS — O26893 Other specified pregnancy related conditions, third trimester: Secondary | ICD-10-CM

## 2017-11-02 DIAGNOSIS — Z3A29 29 weeks gestation of pregnancy: Secondary | ICD-10-CM | POA: Insufficient documentation

## 2017-11-02 DIAGNOSIS — O133 Gestational [pregnancy-induced] hypertension without significant proteinuria, third trimester: Secondary | ICD-10-CM | POA: Diagnosis not present

## 2017-11-02 DIAGNOSIS — R51 Headache: Secondary | ICD-10-CM | POA: Diagnosis not present

## 2017-11-02 LAB — CBC
HEMATOCRIT: 29.5 % — AB (ref 36.0–46.0)
HEMOGLOBIN: 9.5 g/dL — AB (ref 12.0–15.0)
MCH: 27.3 pg (ref 26.0–34.0)
MCHC: 32.2 g/dL (ref 30.0–36.0)
MCV: 84.8 fL (ref 78.0–100.0)
Platelets: 261 10*3/uL (ref 150–400)
RBC: 3.48 MIL/uL — AB (ref 3.87–5.11)
RDW: 13.2 % (ref 11.5–15.5)
WBC: 8.5 10*3/uL (ref 4.0–10.5)

## 2017-11-02 LAB — WET PREP, GENITAL
Sperm: NONE SEEN
Trich, Wet Prep: NONE SEEN
Yeast Wet Prep HPF POC: NONE SEEN

## 2017-11-02 LAB — PROTEIN / CREATININE RATIO, URINE
CREATININE, URINE: 161 mg/dL
Protein Creatinine Ratio: 0.16 mg/mg{Cre} — ABNORMAL HIGH (ref 0.00–0.15)
TOTAL PROTEIN, URINE: 26 mg/dL

## 2017-11-02 LAB — COMPREHENSIVE METABOLIC PANEL
ALBUMIN: 3.1 g/dL — AB (ref 3.5–5.0)
ALK PHOS: 134 U/L — AB (ref 38–126)
ALT: 16 U/L (ref 0–44)
ANION GAP: 8 (ref 5–15)
AST: 18 U/L (ref 15–41)
BILIRUBIN TOTAL: 0.8 mg/dL (ref 0.3–1.2)
BUN: 8 mg/dL (ref 6–20)
CO2: 22 mmol/L (ref 22–32)
CREATININE: 0.61 mg/dL (ref 0.44–1.00)
Calcium: 9.1 mg/dL (ref 8.9–10.3)
Chloride: 105 mmol/L (ref 98–111)
GFR calc Af Amer: >60 mL/min (ref >60)
GFR calc non Af Amer: >60 mL/min (ref >60)
GLUCOSE: 99 mg/dL (ref 70–99)
Potassium: 3.7 mmol/L (ref 3.5–5.1)
SODIUM: 135 mmol/L (ref 135–145)
TOTAL PROTEIN: 7 g/dL (ref 6.5–8.1)

## 2017-11-02 MED ORDER — ACETAMINOPHEN 500 MG PO TABS
1000.0000 mg | ORAL_TABLET | Freq: Once | ORAL | Status: AC
  Administered 2017-11-02: 1000 mg via ORAL
  Filled 2017-11-02: qty 2

## 2017-11-02 NOTE — MAU Provider Note (Signed)
History     CSN: [id number]  Arrival date and time: 11/02/17 1653   First Provider Initiated Contact with Patient 11/02/17 1730      Chief Complaint  Patient presents with  . Fatigue  . Vaginal Discharge  . Nausea  . Shortness of Breath  . Numbness   HPI Kelly Willis is a 22 y.o. G2P1001 at [redacted]w[redacted]d who presents with multiple complaints. She states she woke up this morning with body aches, a headache and seeing spots. She states she tried to sleep to make the pain go away but got no relief. She rates the headache an 8/10 and has not tried anything for the pain. She also reports intermittent tightening in her abdomen and a clear discharge with no odor. She states she feels like it is difficult for her to get a deep breath. She denies vaginal bleeding or leaking of fluid. Reports normal fetal movement.   OB History    Gravida  2   Para  1   Term  1   Preterm  0   AB  0   Living  1     SAB  0   TAB  0   Ectopic  0   Multiple  0   Live Births  1           Past Medical History:  Diagnosis Date  . ADHD (attention deficit hyperactivity disorder)   . Gestational diabetes   . Obesity   . Pilonidal cyst 06/2014    Past Surgical History:  Procedure Laterality Date  . NO PAST SURGERIES    . PILONIDAL CYST EXCISION    . PILONIDAL CYST EXCISION N/A 07/08/2014   Procedure: CYST EXCISION PILONIDAL ;  Surgeon: [name], MD;  Location: [facility];  Service: General;  Laterality: N/A;    Family History  Problem Relation Age of Onset  . Hypertension Maternal Grandfather     Social History   Tobacco Use  . Smoking status: Former Smoker    Years: 2.00    Types: Cigarettes    Last attempt to quit: 04/15/2017    Years since quitting: 0.5  . Smokeless tobacco: Never Used  . Tobacco comment: 4 cig./day  Substance Use Topics  . Alcohol use: Not Currently    Comment: occasionally  . Drug use: No    Allergies:  Allergies  Allergen  Reactions  . Pepto-Bismol [Bismuth Subsalicylate] Nausea And Vomiting    Medications Prior to Admission  Medication Sig Dispense Refill Last Dose  . ferrous sulfate 325 (65 FE) MG tablet Take 1 tablet (325 mg total) by mouth 2 (two) times daily with a meal. 60 tablet 3   . PRENATAL 27-1 MG TABS Take 1 tablet by mouth daily. 30 each 9 Taking    Review of Systems  Constitutional: Negative.  Negative for fatigue and fever.  HENT: Negative.   Eyes: Positive for visual disturbance.  Respiratory: Negative.  Negative for shortness of breath.   Cardiovascular: Negative.  Negative for chest pain.  Gastrointestinal: Positive for abdominal pain. Negative for constipation, diarrhea, nausea and vomiting.  Genitourinary: Positive for vaginal discharge. Negative for dysuria and vaginal bleeding.  Neurological: Positive for headaches. Negative for dizziness.   Physical Exam   Blood pressure 117/90, pulse (!) 109, temperature 97.6 F (36.4 C), temperature source Oral, resp. rate 20, weight 131.1 kg, last menstrual period 04/07/2017, SpO2 99 %, not currently breastfeeding.  Patient Vitals for the past 24 hrs:  BP  Temp Temp src Pulse Resp SpO2 Weight  11/02/17 1800 110/71 - - (!) 106 - - -  11/02/17 1747 104/60 - - (!) 104 - - -  11/02/17 1733 127/75 - - (!) 105 - - -  11/02/17 1728 117/90 - - (!) 109 - - -  11/02/17 1708 (!) 142/78 97.6 F (36.4 C) Oral 96 20 99 % 131.1 kg    Physical Exam  Nursing note and vitals reviewed. Constitutional: She is oriented to person, place, and time. She appears well-developed and well-nourished. No distress.  HENT:  Head: Normocephalic.  Eyes: Pupils are equal, round, and reactive to light.  Cardiovascular: Normal rate, regular rhythm and normal heart sounds.  Respiratory: Effort normal and breath sounds normal. No respiratory distress.  GI: Soft. Bowel sounds are normal. She exhibits no distension. There is no tenderness.  Neurological: She is alert and  oriented to person, place, and time.  Skin: Skin is warm and dry.  Psychiatric: She has a normal mood and affect. Her behavior is normal. Judgment and thought content normal.    Fetal Tracing:  Baseline: 140 Variability: moderate Accels: 10x10 Decels: none  Toco: none  Dilation: Closed Effacement (%): Thick Cervical Position: Posterior Exam by:: Ma Hillock CNM   MAU Course  Procedures Results for orders placed or performed during the hospital encounter of 11/02/17 (from the past 24 hour(s))  CBC     Status: Abnormal   Collection Time: 11/02/17  5:33 PM  Result Value Ref Range   WBC 8.5 4.0 - 10.5 K/uL   RBC 3.48 (L) 3.87 - 5.11 MIL/uL   Hemoglobin 9.5 (L) 12.0 - 15.0 g/dL   HCT 16.1 (L) 09.6 - 04.5 %   MCV 84.8 78.0 - 100.0 fL   MCH 27.3 26.0 - 34.0 pg   MCHC 32.2 30.0 - 36.0 g/dL   RDW 40.9 81.1 - 91.4 %   Platelets 261 150 - 400 K/uL  Comprehensive metabolic panel     Status: Abnormal   Collection Time: 11/02/17  5:33 PM  Result Value Ref Range   Sodium 135 135 - 145 mmol/L   Potassium 3.7 3.5 - 5.1 mmol/L   Chloride 105 98 - 111 mmol/L   CO2 22 22 - 32 mmol/L   Glucose, Bld 99 70 - 99 mg/dL   BUN 8 6 - 20 mg/dL   Creatinine, Ser 7.82 0.44 - 1.00 mg/dL   Calcium 9.1 8.9 - 95.6 mg/dL   Total Protein 7.0 6.5 - 8.1 g/dL   Albumin 3.1 (L) 3.5 - 5.0 g/dL   AST 18 15 - 41 U/L   ALT 16 0 - 44 U/L   Alkaline Phosphatase 134 (H) 38 - 126 U/L   Total Bilirubin 0.8 0.3 - 1.2 mg/dL   GFR calc non Af Amer >60 >60 mL/min   GFR calc Af Amer >60 >60 mL/min   Anion gap 8 5 - 15  Protein / creatinine ratio, urine     Status: Abnormal   Collection Time: 11/02/17  5:36 PM  Result Value Ref Range   Creatinine, Urine 161.00 mg/dL   Total Protein, Urine 26 mg/dL   Protein Creatinine Ratio 0.16 (H) 0.00 - 0.15 mg/mg[Cre]  Wet prep, genital     Status: Abnormal   Collection Time: 11/02/17  5:46 PM  Result Value Ref Range   Yeast Wet Prep HPF POC NONE SEEN NONE SEEN   Trich, Wet  Prep NONE SEEN NONE SEEN   Clue Cells Wet  Prep HPF POC PRESENT (A) NONE SEEN   WBC, Wet Prep HPF POC MODERATE (A) NONE SEEN   Sperm NONE SEEN      MDM UA CBC, CMP, Protein/creat ratio Tylenol 1000mg  PO Wet prep and gc/chlamydia  Patient reports complete relief from pain. Wet prep shows clue cells but patient denies symptoms of BV, will not treat at this time.   Assessment and Plan   1. Pregnancy headache in third trimester   2. Transient hypertension of pregnancy in third trimester   3. [redacted] weeks gestation of pregnancy    -Discharge home in stable condition -Encouraged patient to use tylenol PRN for pain -Preeclampsia precautions discussed -[facility] -Patient may return to MAU as needed or if her condition were to change or worsen  Rolm BookbinderCaroline M Neill CNM 11/02/2017, 5:30 PM

## 2017-11-02 NOTE — MAU Note (Signed)
Kelly Willis is a 22 y.o. at 477w6d here in MAU reporting: states that she has been in bed all day. Generalized aches. Nausea; states feels the urge to throw up but cant/gagging. No appetite. Difficulty walking/numbess in legs. Difficulty catching her breath. +lower abdominal pain that is sharp and constant. Denies fever. Cloudy white discharge.  Onset of complaint: all started this morning when she woke up.  Pain score: abdominal pain 8/10. Generalized aches 10/10 Denies vaginal bleeding or leaking. +FM Vitals:   11/02/17 1832 11/02/17 1850  BP: 104/68   Pulse: 99   Resp:  20  Temp:  98.1 F (36.7 C)  SpO2:      Lab orders placed from triage: ua

## 2017-11-02 NOTE — Discharge Instructions (Signed)

## 2017-11-02 NOTE — Progress Notes (Signed)
Vaginal cultures obtained by C. Neill, CNM 

## 2017-11-03 ENCOUNTER — Encounter: Payer: 59 | Admitting: Nurse Practitioner

## 2017-11-03 ENCOUNTER — Other Ambulatory Visit (HOSPITAL_COMMUNITY): Payer: Self-pay | Admitting: *Deleted

## 2017-11-03 ENCOUNTER — Ambulatory Visit (HOSPITAL_COMMUNITY)
Admission: RE | Admit: 2017-11-03 | Discharge: 2017-11-03 | Disposition: A | Discharge status: Home / Self Care | Payer: 59 | Source: Ambulatory Visit | Attending: Family Medicine | Admitting: Family Medicine

## 2017-11-03 DIAGNOSIS — Z3A30 30 weeks gestation of pregnancy: Secondary | ICD-10-CM | POA: Diagnosis not present

## 2017-11-03 DIAGNOSIS — Z362 Encounter for other antenatal screening follow-up: Secondary | ICD-10-CM | POA: Diagnosis present

## 2017-11-03 DIAGNOSIS — R634 Abnormal weight loss: Secondary | ICD-10-CM

## 2017-11-03 DIAGNOSIS — O99213 Obesity complicating pregnancy, third trimester: Secondary | ICD-10-CM | POA: Insufficient documentation

## 2017-11-03 DIAGNOSIS — O09293 Supervision of pregnancy with other poor reproductive or obstetric history, third trimester: Secondary | ICD-10-CM | POA: Diagnosis not present

## 2017-11-03 DIAGNOSIS — O0933 Supervision of pregnancy with insufficient antenatal care, third trimester: Secondary | ICD-10-CM | POA: Insufficient documentation

## 2017-11-03 DIAGNOSIS — Z3490 Encounter for supervision of normal pregnancy, unspecified, unspecified trimester: Secondary | ICD-10-CM

## 2017-11-03 LAB — GC/CHLAMYDIA PROBE AMP (~~LOC~~) NOT AT ARMC
Chlamydia: NEGATIVE
NEISSERIA GONORRHEA: NEGATIVE

## 2017-11-05 ENCOUNTER — Other Ambulatory Visit (HOSPITAL_COMMUNITY): Payer: Self-pay | Admitting: *Deleted

## 2017-12-01 ENCOUNTER — Ambulatory Visit (HOSPITAL_COMMUNITY)
Admission: RE | Admit: 2017-12-01 | Discharge: 2017-12-01 | Disposition: A | Discharge status: Home / Self Care | Payer: 59 | Source: Ambulatory Visit | Attending: Obstetrics and Gynecology | Admitting: Obstetrics and Gynecology

## 2017-12-01 DIAGNOSIS — Z362 Encounter for other antenatal screening follow-up: Secondary | ICD-10-CM

## 2017-12-01 DIAGNOSIS — Z3A34 34 weeks gestation of pregnancy: Secondary | ICD-10-CM | POA: Insufficient documentation

## 2017-12-01 DIAGNOSIS — O09293 Supervision of pregnancy with other poor reproductive or obstetric history, third trimester: Secondary | ICD-10-CM

## 2017-12-01 DIAGNOSIS — O99213 Obesity complicating pregnancy, third trimester: Secondary | ICD-10-CM | POA: Insufficient documentation

## 2017-12-01 DIAGNOSIS — O0933 Supervision of pregnancy with insufficient antenatal care, third trimester: Secondary | ICD-10-CM

## 2017-12-02 ENCOUNTER — Other Ambulatory Visit: Payer: 59

## 2017-12-09 ENCOUNTER — Encounter: Payer: 59 | Admitting: Student

## 2017-12-15 ENCOUNTER — Ambulatory Visit (HOSPITAL_COMMUNITY): Admission: RE | Admit: 2017-12-15 | Payer: 59 | Source: Ambulatory Visit

## 2017-12-20 ENCOUNTER — Encounter (HOSPITAL_COMMUNITY): Payer: Self-pay | Admitting: *Deleted

## 2017-12-20 ENCOUNTER — Inpatient Hospital Stay (HOSPITAL_COMMUNITY)
Admission: AD | Admit: 2017-12-20 | Discharge: 2017-12-20 | Disposition: A | Discharge status: Home / Self Care | Payer: 59 | Source: Ambulatory Visit | Attending: Family Medicine | Admitting: Family Medicine

## 2017-12-20 DIAGNOSIS — R109 Unspecified abdominal pain: Secondary | ICD-10-CM

## 2017-12-20 DIAGNOSIS — Z87891 Personal history of nicotine dependence: Secondary | ICD-10-CM | POA: Diagnosis not present

## 2017-12-20 DIAGNOSIS — O99613 Diseases of the digestive system complicating pregnancy, third trimester: Secondary | ICD-10-CM | POA: Insufficient documentation

## 2017-12-20 DIAGNOSIS — K529 Noninfective gastroenteritis and colitis, unspecified: Secondary | ICD-10-CM | POA: Diagnosis not present

## 2017-12-20 DIAGNOSIS — O212 Late vomiting of pregnancy: Secondary | ICD-10-CM | POA: Insufficient documentation

## 2017-12-20 DIAGNOSIS — R197 Diarrhea, unspecified: Secondary | ICD-10-CM

## 2017-12-20 DIAGNOSIS — Z3A36 36 weeks gestation of pregnancy: Secondary | ICD-10-CM | POA: Insufficient documentation

## 2017-12-20 DIAGNOSIS — O219 Vomiting of pregnancy, unspecified: Secondary | ICD-10-CM

## 2017-12-20 DIAGNOSIS — O26893 Other specified pregnancy related conditions, third trimester: Secondary | ICD-10-CM | POA: Diagnosis not present

## 2017-12-20 LAB — URINALYSIS, ROUTINE W REFLEX MICROSCOPIC
Bacteria, UA: NONE SEEN
Bilirubin Urine: NEGATIVE
Glucose, UA: NEGATIVE mg/dL
Hgb urine dipstick: NEGATIVE
Ketones, ur: 5 mg/dL — AB
Nitrite: NEGATIVE
PH: 6 (ref 5.0–8.0)
Protein, ur: 30 mg/dL — AB
SPECIFIC GRAVITY, URINE: 1.029 (ref 1.005–1.030)
WBC, UA: >50 WBC/hpf — ABNORMAL HIGH (ref 0–5)

## 2017-12-20 MED ORDER — ONDANSETRON 4 MG PO TBDP: 4.0000 mg | ORAL_TABLET | Freq: Four times a day (QID) | ORAL | 0 refills | Status: DC | PRN

## 2017-12-20 NOTE — MAU Note (Signed)
Kelly Willis is a 22 y.o. at [redacted]w[redacted]d here in MAU reporting: +contractions (lower back) +vaginal pressure Denies recent VE  Onset of complaint: ongoing since 4am +N/V/D Pain score: 7/10 Vitals:   12/20/17 1639  BP: 125/84  Pulse: 86  Resp: 20  Temp: 97.6 F (36.4 C)  SpO2: 100%     +FM Lab orders placed from triage: ua

## 2017-12-20 NOTE — MAU Provider Note (Signed)
Chief Complaint:  Contractions; vaginal pressure; Emesis; and Diarrhea   None     HPI: Kelly Willis is a 22 y.o. G2P1001 at 56w5dwho presents to maternity admissions reporting contractions, n/v and diarrhea. Symptoms started yesterday. She reports bowel movements once daily, but today was soft.  She reports emesis 3-4 times, making it hard to keep down any food or fluids.  Cramping is low in her abdomen, intermittent, and radiates to her low back.  She feels some pelvic pressure with contractions also. She has not tried any treatments. There are no other symptoms.   She reports good fetal movement, denies LOF, vaginal bleeding or fever/chills.  HPI  Past Medical History: Past Medical History:  Diagnosis Date  . ADHD (attention deficit hyperactivity disorder)   . Gestational diabetes   . Obesity   . Pilonidal cyst 06/2014    Past obstetric history: OB History  Gravida Para Term Preterm AB Living  2 1 1  0 0 1  SAB TAB Ectopic Multiple Live Births  0 0 0 0 1    # Outcome Date GA Lbr Len/2nd Weight Sex Delivery Anes PTL Lv  2 Current           1 Term 01/21/16 110w1d 11:38 / 00:09 3170 g F Vag-Spont EPI  LIV     Birth Comments: GDM    Past Surgical History: Past Surgical History:  Procedure Laterality Date  . NO PAST SURGERIES    . PILONIDAL CYST EXCISION    . PILONIDAL CYST EXCISION N/A 07/08/2014   Procedure: CYST EXCISION PILONIDAL ;  Surgeon: [name], MD;  Location: [facility];  Service: General;  Laterality: N/A;    Family History: Family History  Problem Relation Age of Onset  . Hypertension Maternal Grandfather     Social History: Social History   Tobacco Use  . Smoking status: Former Smoker    Years: 2.00    Types: Cigarettes    Last attempt to quit: 04/15/2017    Years since quitting: 0.6  . Smokeless tobacco: Never Used  . Tobacco comment: 4 cig./day  Substance Use Topics  . Alcohol use: Not Currently    Comment: occasionally  .  Drug use: No    Allergies:  Allergies  Allergen Reactions  . Pepto-Bismol [Bismuth Subsalicylate] Nausea And Vomiting    Meds:  Medications Prior to Admission  Medication Sig Dispense Refill Last Dose  . ferrous sulfate 325 (65 FE) MG tablet Take 1 tablet (325 mg total) by mouth 2 (two) times daily with a meal. 60 tablet 3   . PRENATAL 27-1 MG TABS Take 1 tablet by mouth daily. 30 each 9 Taking    ROS:  Review of Systems  Constitutional: Negative for chills, fatigue and fever.  Eyes: Negative for visual disturbance.  Respiratory: Negative for shortness of breath.   Cardiovascular: Negative for chest pain.  Gastrointestinal: Positive for abdominal pain, diarrhea, nausea and vomiting.  Genitourinary: Positive for pelvic pain. Negative for difficulty urinating, dysuria, flank pain, vaginal bleeding, vaginal discharge and vaginal pain.  Musculoskeletal: Positive for back pain.  Neurological: Negative for dizziness and headaches.  Psychiatric/Behavioral: Negative.      I have reviewed patient's Past Medical Hx, Surgical Hx, Family Hx, Social Hx, medications and allergies.   Physical Exam   Patient Vitals for the past 24 hrs:  BP Temp Temp src Pulse Resp SpO2 Weight  12/20/17 1639 125/84 97.6 F (36.4 C) Oral 86 20 100 % 131.1 kg  Constitutional: Well-developed, well-nourished female in no acute distress.  Cardiovascular: normal rate Respiratory: normal effort GI: Abd soft, non-tender, gravid appropriate for gestational age.  MS: Extremities nontender, no edema, normal ROM Neurologic: Alert and oriented x 4.  GU: Neg CVAT.  PELVIC EXAM: Cervix pink, visually closed, without lesion, scant white creamy discharge, vaginal walls and external genitalia normal Bimanual exam: Cervix 0/long/high, firm, anterior, neg CMT, uterus nontender, nonenlarged, adnexa without tenderness, enlargement, or mass  Dilation: 1.5 Effacement (%): Thick Cervical Position: Posterior Station:  Ballotable Exam by:: K.Wilson,RN  FHT:  Baseline 135 , moderate variability, accelerations present, no decelerations Contractions: irregular, irritability, mild to palpation   Labs: Results for orders placed or performed during the hospital encounter of 12/20/17 (from the past 24 hour(s))  Urinalysis, Routine w reflex microscopic     Status: Abnormal   Collection Time: 12/20/17  5:07 PM  Result Value Ref Range   Color, Urine AMBER (A) YELLOW   APPearance CLOUDY (A) CLEAR   Specific Gravity, Urine 1.029 1.005 - 1.030   pH 6.0 5.0 - 8.0   Glucose, UA NEGATIVE NEGATIVE mg/dL   Hgb urine dipstick NEGATIVE NEGATIVE   Bilirubin Urine NEGATIVE NEGATIVE   Ketones, ur 5 (A) NEGATIVE mg/dL   Protein, ur 30 (A) NEGATIVE mg/dL   Nitrite NEGATIVE NEGATIVE   Leukocytes, UA MODERATE (A) NEGATIVE   RBC / HPF 0-5 0 - 5 RBC/hpf   WBC, UA >50 (H) 0 - 5 WBC/hpf   Bacteria, UA NONE SEEN NONE SEEN   Squamous Epithelial / LPF 11-20 0 - 5   Mucus PRESENT    O/Positive/-- (06/05 1555)  Imaging:    MAU Course/MDM: I have ordered labs and reviewed results.  NST reviewed and reactive Pt 1.5 cm but thick on initial exam, no regular contractions on toco. Offered to recheck cervix in 1 hour but pt declines. Would prefer to treat GI symptoms and come back if contractions worsen. Likely viral gastroenteritis vs early labor Zofran 4 mg ODT Q 6 hours PRN F/U in office as scheduled Return to MAU as needed for emergencies Pt discharge with strict preterm labor precautions.  Today's evaluation included a work-up for preterm labor which can be life-threatening for both mom and baby.  Assessment: 1. Nausea and vomiting during pregnancy   2. Diarrhea, unspecified type   3. Gastroenteritis   4. Abdominal pain during pregnancy, third trimester     Plan: Discharge home Labor precautions and fetal kick counts Follow-up Information    [facility].   Specialty:  Obstetrics  and Gynecology Why:  As scheduled, return to MAU as needed for signs of labor or emergencies. Contact information: [address] [place] [address] [phone]           Sharen Counter Certified Nurse-Midwife 12/20/2017 6:26 PM

## 2018-01-03 ENCOUNTER — Encounter (HOSPITAL_COMMUNITY): Payer: Self-pay

## 2018-01-03 ENCOUNTER — Other Ambulatory Visit: Payer: Self-pay

## 2018-01-03 ENCOUNTER — Inpatient Hospital Stay (HOSPITAL_COMMUNITY)
Admission: AD | Admit: 2018-01-03 | Discharge: 2018-01-05 | DRG: 807 | Disposition: A | Discharge status: Home / Self Care | Payer: 59 | Attending: Obstetrics and Gynecology | Admitting: Obstetrics and Gynecology

## 2018-01-03 DIAGNOSIS — E669 Obesity, unspecified: Secondary | ICD-10-CM | POA: Diagnosis present

## 2018-01-03 DIAGNOSIS — O9902 Anemia complicating childbirth: Secondary | ICD-10-CM | POA: Diagnosis present

## 2018-01-03 DIAGNOSIS — O361990 Maternal care for other isoimmunization, unspecified trimester, not applicable or unspecified: Secondary | ICD-10-CM | POA: Diagnosis present

## 2018-01-03 DIAGNOSIS — O99214 Obesity complicating childbirth: Secondary | ICD-10-CM | POA: Diagnosis present

## 2018-01-03 DIAGNOSIS — O99824 Streptococcus B carrier state complicating childbirth: Secondary | ICD-10-CM | POA: Diagnosis present

## 2018-01-03 DIAGNOSIS — O361930 Maternal care for other isoimmunization, third trimester, not applicable or unspecified: Secondary | ICD-10-CM | POA: Diagnosis present

## 2018-01-03 DIAGNOSIS — Z87891 Personal history of nicotine dependence: Secondary | ICD-10-CM

## 2018-01-03 DIAGNOSIS — Z8632 Personal history of gestational diabetes: Secondary | ICD-10-CM | POA: Diagnosis present

## 2018-01-03 DIAGNOSIS — Z3A37 37 weeks gestation of pregnancy: Secondary | ICD-10-CM | POA: Diagnosis not present

## 2018-01-03 DIAGNOSIS — O0930 Supervision of pregnancy with insufficient antenatal care, unspecified trimester: Secondary | ICD-10-CM

## 2018-01-03 DIAGNOSIS — Z3A38 38 weeks gestation of pregnancy: Secondary | ICD-10-CM | POA: Diagnosis not present

## 2018-01-03 DIAGNOSIS — D649 Anemia, unspecified: Secondary | ICD-10-CM | POA: Diagnosis present

## 2018-01-03 DIAGNOSIS — Z3483 Encounter for supervision of other normal pregnancy, third trimester: Secondary | ICD-10-CM | POA: Diagnosis present

## 2018-01-03 DIAGNOSIS — O361910 Maternal care for other isoimmunization, first trimester, not applicable or unspecified: Secondary | ICD-10-CM | POA: Diagnosis present

## 2018-01-03 LAB — CBC
HCT: 36.7 % (ref 36.0–46.0)
Hemoglobin: 11.6 g/dL — ABNORMAL LOW (ref 12.0–15.0)
MCH: 26.8 pg (ref 26.0–34.0)
MCHC: 31.6 g/dL (ref 30.0–36.0)
MCV: 84.8 fL (ref 80.0–100.0)
NRBC: 0 % (ref 0.0–0.2)
PLATELETS: 266 10*3/uL (ref 150–400)
RBC: 4.33 MIL/uL (ref 3.87–5.11)
RDW: 13.6 % (ref 11.5–15.5)
WBC: 10.2 10*3/uL (ref 4.0–10.5)

## 2018-01-03 LAB — HEMOGLOBIN A1C
HEMOGLOBIN A1C: 4.3 % — AB (ref 4.8–5.6)
Mean Plasma Glucose: 76.71 mg/dL

## 2018-01-03 MED ORDER — LACTATED RINGERS IV SOLN
INTRAVENOUS | Status: DC
  Administered 2018-01-03: 18:00:00 via INTRAVENOUS

## 2018-01-03 MED ORDER — WITCH HAZEL-GLYCERIN EX PADS: 1.0000 "application " | MEDICATED_PAD | CUTANEOUS | Status: DC | PRN

## 2018-01-03 MED ORDER — ONDANSETRON HCL 4 MG/2ML IJ SOLN: 4.0000 mg | Freq: Four times a day (QID) | INTRAMUSCULAR | Status: DC | PRN

## 2018-01-03 MED ORDER — EPHEDRINE 5 MG/ML INJ
10.0000 mg | INTRAVENOUS | Status: DC | PRN
  Filled 2018-01-03: qty 2

## 2018-01-03 MED ORDER — OXYTOCIN 40 UNITS IN LACTATED RINGERS INFUSION - SIMPLE MED: 2.5000 [IU]/h | INTRAVENOUS | Status: DC

## 2018-01-03 MED ORDER — FENTANYL 2.5 MCG/ML BUPIVACAINE 1/10 % EPIDURAL INFUSION (WH - ANES)
14.0000 mL/h | INTRAMUSCULAR | Status: DC | PRN
  Filled 2018-01-03: qty 100

## 2018-01-03 MED ORDER — OXYCODONE-ACETAMINOPHEN 5-325 MG PO TABS: 1.0000 | ORAL_TABLET | ORAL | Status: DC | PRN

## 2018-01-03 MED ORDER — OXYCODONE-ACETAMINOPHEN 5-325 MG PO TABS: 2.0000 | ORAL_TABLET | ORAL | Status: DC | PRN

## 2018-01-03 MED ORDER — LACTATED RINGERS IV SOLN: 500.0000 mL | INTRAVENOUS | Status: DC | PRN

## 2018-01-03 MED ORDER — TETANUS-DIPHTH-ACELL PERTUSSIS 5-2.5-18.5 LF-MCG/0.5 IM SUSP: 0.5000 mL | Freq: Once | INTRAMUSCULAR | Status: DC

## 2018-01-03 MED ORDER — COCONUT OIL OIL: 1.0000 "application " | TOPICAL_OIL | Status: DC | PRN

## 2018-01-03 MED ORDER — PRENATAL MULTIVITAMIN CH
1.0000 | ORAL_TABLET | Freq: Every day | ORAL | Status: DC
  Administered 2018-01-04 – 2018-01-05 (×2): 1 via ORAL
  Filled 2018-01-03 (×2): qty 1

## 2018-01-03 MED ORDER — SIMETHICONE 80 MG PO CHEW: 80.0000 mg | CHEWABLE_TABLET | ORAL | Status: DC | PRN

## 2018-01-03 MED ORDER — LACTATED RINGERS IV SOLN: 500.0000 mL | Freq: Once | INTRAVENOUS | Status: DC

## 2018-01-03 MED ORDER — IBUPROFEN 600 MG PO TABS
600.0000 mg | ORAL_TABLET | Freq: Four times a day (QID) | ORAL | Status: DC
  Administered 2018-01-03 – 2018-01-05 (×7): 600 mg via ORAL
  Filled 2018-01-03 (×7): qty 1

## 2018-01-03 MED ORDER — OXYTOCIN BOLUS FROM INFUSION: 500.0000 mL | Freq: Once | INTRAVENOUS | Status: DC

## 2018-01-03 MED ORDER — PHENYLEPHRINE 40 MCG/ML (10ML) SYRINGE FOR IV PUSH (FOR BLOOD PRESSURE SUPPORT)
80.0000 ug | PREFILLED_SYRINGE | INTRAVENOUS | Status: DC | PRN
  Filled 2018-01-03: qty 5

## 2018-01-03 MED ORDER — ACETAMINOPHEN 325 MG PO TABS: 650.0000 mg | ORAL_TABLET | ORAL | Status: DC | PRN

## 2018-01-03 MED ORDER — FENTANYL CITRATE (PF) 100 MCG/2ML IJ SOLN
100.0000 ug | INTRAMUSCULAR | Status: DC | PRN
  Administered 2018-01-03: 100 ug via INTRAVENOUS
  Filled 2018-01-03: qty 2

## 2018-01-03 MED ORDER — LIDOCAINE HCL (PF) 1 % IJ SOLN
30.0000 mL | INTRAMUSCULAR | Status: DC | PRN
  Filled 2018-01-03: qty 30

## 2018-01-03 MED ORDER — SENNOSIDES-DOCUSATE SODIUM 8.6-50 MG PO TABS
2.0000 | ORAL_TABLET | ORAL | Status: DC
  Administered 2018-01-03 – 2018-01-04 (×2): 2 via ORAL
  Filled 2018-01-03 (×2): qty 2

## 2018-01-03 MED ORDER — ONDANSETRON HCL 4 MG/2ML IJ SOLN: 4.0000 mg | INTRAMUSCULAR | Status: DC | PRN

## 2018-01-03 MED ORDER — BENZOCAINE-MENTHOL 20-0.5 % EX AERO: 1.0000 "application " | INHALATION_SPRAY | CUTANEOUS | Status: DC | PRN

## 2018-01-03 MED ORDER — SODIUM CHLORIDE 0.9 % IV SOLN
2.0000 g | Freq: Once | INTRAVENOUS | Status: AC
  Administered 2018-01-03: 2 g via INTRAVENOUS
  Filled 2018-01-03: qty 2

## 2018-01-03 MED ORDER — SOD CITRATE-CITRIC ACID 500-334 MG/5ML PO SOLN: 30.0000 mL | ORAL | Status: DC | PRN

## 2018-01-03 MED ORDER — DIPHENHYDRAMINE HCL 25 MG PO CAPS: 25.0000 mg | ORAL_CAPSULE | Freq: Four times a day (QID) | ORAL | Status: DC | PRN

## 2018-01-03 MED ORDER — OXYTOCIN 40 UNITS IN LACTATED RINGERS INFUSION - SIMPLE MED
INTRAVENOUS | Status: AC
  Filled 2018-01-03: qty 1000

## 2018-01-03 MED ORDER — ZOLPIDEM TARTRATE 5 MG PO TABS: 5.0000 mg | ORAL_TABLET | Freq: Every evening | ORAL | Status: DC | PRN

## 2018-01-03 MED ORDER — PHENYLEPHRINE 40 MCG/ML (10ML) SYRINGE FOR IV PUSH (FOR BLOOD PRESSURE SUPPORT)
80.0000 ug | PREFILLED_SYRINGE | INTRAVENOUS | Status: DC | PRN
  Filled 2018-01-03: qty 5
  Filled 2018-01-03: qty 10

## 2018-01-03 MED ORDER — ONDANSETRON HCL 4 MG PO TABS: 4.0000 mg | ORAL_TABLET | ORAL | Status: DC | PRN

## 2018-01-03 MED ORDER — DIPHENHYDRAMINE HCL 50 MG/ML IJ SOLN: 12.5000 mg | INTRAMUSCULAR | Status: DC | PRN

## 2018-01-03 MED ORDER — DIBUCAINE 1 % RE OINT: 1.0000 "application " | TOPICAL_OINTMENT | RECTAL | Status: DC | PRN

## 2018-01-03 NOTE — H&P (Signed)
LABOR AND DELIVERY ADMISSION HISTORY AND PHYSICAL NOTE  Kelly Willis is a 22 y.o. female G2P1001 with IUP at 1341w5d by LMP c/w early sono presenting for SOL. Patient delivered in room prior to being seen by clinician.  She reports positive fetal movement. She denies leakage of fluid or vaginal bleeding.  Sono 10/21: EFW 2527g, 76%   Prenatal History/Complications[facility]   Pregnancy complications:  - limited PNC (last visit 9/9), reported transportation issues  -h/o A1GDM with prior pregnancy, never had GTT this pregnancy  -anemia on PO Fe  -h/o pilonidal cyst  -h/o ADHD   Past Medical History: Past Medical History:  Diagnosis Date  . ADHD (attention deficit hyperactivity disorder)   . Gestational diabetes   . Obesity   . Pilonidal cyst 06/2014    Past Surgical History: Past Surgical History:  Procedure Laterality Date  . NO PAST SURGERIES    . PILONIDAL CYST EXCISION    . PILONIDAL CYST EXCISION N/A 07/08/2014   Procedure: CYST EXCISION PILONIDAL ;  Surgeon: Avel Peaceodd Rosenbower, MD;  Location: [facility];  Service: General;  Laterality: N/A;    Obstetrical History: OB History    Gravida  2   Para  2   Term  2   Preterm  0   AB  0   Living  2     SAB  0   TAB  0   Ectopic  0   Multiple  0   Live Births  2           Social History: Social History   Socioeconomic History  . Marital status: Single    Spouse name: Not on file  . Number of children: Not on file  . Years of education: Not on file  . Highest education level: Not on file  Occupational History  . Not on file  Social Needs  . Financial resource strain: Not on file  . Food insecurity:    Worry: Not on file    Inability: Not on file  . Transportation needs:    Medical: Not on file    Non-medical: Not on file  Tobacco Use  . Smoking status: Former Smoker    Years: 2.00    Types: Cigarettes     Last attempt to quit: 04/15/2017    Years since quitting: 0.7  . Smokeless tobacco: Never Used  . Tobacco comment: 4 cig./day  Substance and Sexual Activity  . Alcohol use: Not Currently    Comment: occasionally  . Drug use: No  . Sexual activity: Yes    Partners: Male    Birth control/protection: None  Lifestyle  . Physical activity:    Days per week: Not on file    Minutes per session: Not on file  . Stress: Not on file  Relationships  . Social connections:    Talks on phone: Not on file    Gets together: Not on file    Attends religious service: Not on file    Active member of club or organization: Not on file    Attends meetings of clubs or organizations: Not on file    Relationship status: Not on file  Other Topics Concern  . Not on file  Social History Narrative  . Not on file    Family History: Family History  Problem Relation Age of Onset  . Hypertension Maternal Grandfather  Allergies: Allergies  Allergen Reactions  . Pepto-Bismol [Bismuth Subsalicylate] Nausea And Vomiting    Medications Prior to Admission  Medication Sig Dispense Refill Last Dose  . ferrous sulfate 325 (65 FE) MG tablet Take 1 tablet (325 mg total) by mouth 2 (two) times daily with a meal. 60 tablet 3   . ondansetron (ZOFRAN ODT) 4 MG disintegrating tablet Take 1 tablet (4 mg total) by mouth every 6 (six) hours as needed for nausea. 20 tablet 0   . PRENATAL 27-1 MG TABS Take 1 tablet by mouth daily. 30 each 9 Taking     Review of Systems  All systems reviewed and negative except as stated in HPI  Physical Exam Blood pressure (!) 150/87, pulse 98, temperature 97.9 F (36.6 C), temperature source Oral, resp. rate 20, last menstrual period 04/07/2017, unknown if currently breastfeeding. General appearance: alert, oriented, NAD Lungs: normal respiratory effort Heart: regular rate Abdomen: soft, non-tender; gravid, FH appropriate for GA Extremities: No calf swelling or  tenderness Presentation: cephalic Dilation: 7 Effacement (%): 80 Station: -1 Exam by:: Marcelle Overlie  Prenatal labs: ABO, Rh: O/Positive/-- (06/05 1555) Antibody: Positive, See Final Results (06/05 1555) Rubella: 3.37 (06/05 1555) RPR: Non Reactive (09/09 1810)  HBsAg: Negative (06/05 1555)  HIV: Non Reactive (09/09 1810)  GC/Chlamydia: Negative  GBS:   Unknown  2-hr GTT: Not performed  Genetic screening:  Not completed  Anatomy US: Normal   Prenatal Transfer Tool  Maternal Diabetes: No, never tested  Genetic Screening: Declined Maternal Ultrasounds/Referrals: Normal Fetal Ultrasounds or other Referrals:  Referred to Materal Fetal Medicine  Maternal Substance Abuse:  No Significant Maternal Medications:  None Significant Maternal Lab Results: None  Results for orders placed or performed during the hospital encounter of 01/03/18 (from the past 24 hour(s))  CBC   Collection Time: 01/03/18  5:42 PM  Result Value Ref Range   WBC 10.2 4.0 - 10.5 K/uL   RBC 4.33 3.87 - 5.11 MIL/uL   Hemoglobin 11.6 (L) 12.0 - 15.0 g/dL   HCT 16.1 09.6 - 04.5 %   MCV 84.8 80.0 - 100.0 fL   MCH 26.8 26.0 - 34.0 pg   MCHC 31.6 30.0 - 36.0 g/dL   RDW 40.9 81.1 - 91.4 %   Platelets 266 150 - 400 K/uL   nRBC 0.0 0.0 - 0.2 %  Hemoglobin A1c   Collection Time: 01/03/18  5:56 PM  Result Value Ref Range   Hgb A1c MFr Bld 4.3 (L) 4.8 - 5.6 %   Mean Plasma Glucose 76.71 mg/dL    Patient Active Problem List   Diagnosis Date Noted  . Indication for care in labor or delivery 01/03/2018  . Microcytic anemia 10/23/2017  . History of gestational diabetes mellitus 10/20/2017  . Lesion of skin of breast 10/20/2017  . Limited prenatal care, antepartum 10/20/2017  . Weight loss 10/20/2017  . New onset of headaches 10/20/2017  . 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 Supervision of low-risk pregnancy 07/16/2017  . Pilonidal abscess 10/16/2012    Assessment: Kelly Willis is a 22 y.o. G2P1001 at [redacted]w[redacted]d here for SOL.   #Labor: Expectant management. Patient with unattended delivery.   #Pain: Planned for epidural, unable to obtain prior to delivery.  #FWB: Cat I prior to delivery.  #ID:  GBS unknown this pregnancy. GBS+ in prior pregnancy, will give Ampicillin due to presenting in advanced labor. Unable to obtain GBS PCR prior to delivery.  #MOF: Bottle  #MOC:IUD  #Circ:  Inpatient   History of A1GDM with Prior Pregnancy: Never had GTT this pregnancy. Sono on 10/21 with EFW 76%. Will obtain A1c.   De Hollingshead 01/03/2018, 7:20 PM

## 2018-01-03 NOTE — MAU Note (Signed)
Ctx since 1100 this am denies LOF or vag. Bleeding

## 2018-01-03 NOTE — MAU Note (Signed)
Pt arrived to MAU via ems.  Pt refused fetal heart monitoring. Doppler FHR of 125 upon arrival but patient refused to keep monitors on.   Pt would not answer medical hx questions during exam.  Pt requesting epidural.

## 2018-01-04 LAB — GROUP B STREP BY PCR: Group B strep by PCR: NEGATIVE

## 2018-01-04 LAB — RPR: RPR: NONREACTIVE

## 2018-01-04 MED ORDER — INFLUENZA VAC SPLIT QUAD 0.5 ML IM SUSY: 0.5000 mL | PREFILLED_SYRINGE | INTRAMUSCULAR | Status: DC

## 2018-01-04 NOTE — Progress Notes (Signed)
POSTPARTUM PROGRESS NOTE  Post Partum Day 1  Subjective:  Kelly Willis is a 22 y.o. [id number] s/p SVD at 7330w5d.  She reports she is doing well. No acute events overnight. She denies any problems with ambulating, voiding or po intake. Denies nausea or vomiting.  Pain is well controlled.  Lochia is mild.  Objective: Blood pressure 101/70, pulse 74, temperature 98 F (36.7 C), temperature source Oral, resp. rate 18, last menstrual period 04/07/2017, SpO2 100 %, unknown if currently breastfeeding.  Physical Exam:  General: alert, cooperative and no distress Chest: no respiratory distress Heart:regular rate, distal pulses intact Abdomen: soft, nontender,  Uterine Fundus: firm, appropriately tender DVT Evaluation: No calf swelling or tenderness Extremities: no edema Skin: warm, dry  Recent Labs    01/03/18 1742  HGB 11.6*  HCT 36.7    Assessment/Plan: Kelly Willis is a 22 y.o. [id number] s/p SVD at 5730w5d   PPD#1 - Doing well  Routine postpartum care  Contraception: IUD Feeding: bottle Dispo: Plan for discharge tomorrow.   LOS: 1 day   [place] Landan Fedie, DO 01/04/2018, 7:14 AM

## 2018-01-04 NOTE — Clinical Social Work Maternal (Addendum)
CLINICAL SOCIAL WORK MATERNAL/CHILD NOTE  Patient Details  Name: Kelly Willis MRN: [id number] Date of Birth: [birth year]  Date:  01/04/2018  Clinical Social Worker Initiating Note:  Kelly Willis Date/Time: Initiated:  01/04/18/1136     Child's Name:  Kelly Willis   Biological Parents:  Mother(FOB is Kelly Willis [employer]. DOB 03/03/1994)   Need for Interpreter:  None   Reason for Referral:  Late or No Prenatal Care , Current Substance Use/Substance Use During Pregnancy (Hx of marijuana use. )   Address:  [place] [address]    Phone number:  [phone] (home)     Additional phone number:   Household Members/Support Persons (HM/SP):   Household Member/Support Person 1   HM/SP Name Relationship DOB or Age  HM/SP -1 Kelly Willis  daughter 01/21/2016  HM/SP -2        HM/SP -3        HM/SP -4        HM/SP -5        HM/SP -6        HM/SP -7        HM/SP -8          Natural Supports (not living in the home):  Immediate Family, Extended Family, Spouse/significant other, Parent(MOB reported that FOB's family will also provide supports. )   Professional Supports: None   Employment: Unemployed   Type of Work:     Education:  9 to 11 years   Homebound arranged:    Museum/gallery curator Resources:      Other Resources:  [facility], Food Stamps    Cultural/Religious Considerations Which May Impact Care:  Per MOB's Face Sheet, MOB is Kelly Willis.   Strengths:  Ability to meet basic needs , Home prepared for child , Pediatrician chosen   Psychotropic Medications:         Pediatrician:    Kelly Willis area  Pediatrician List:   [facility]      Pediatrician Fax Number:    Risk Factors/Current Problems:  Transportation , Substance Use    Cognitive State:  Able to Concentrate , Alert , Insightful , Linear  Thinking    Mood/Affect:  Comfortable , Bright , Interested , Happy , Relaxed    CSW Assessment: CSW met with MOB in room 106 to complete an assessment for limited PNC and hx of substance use. When CSW arrived, MOB was resting in bed engaging in skin to skin to with infant.  FOB was also present and with MOB's permission, CSW asked FOB to leave the room in order to meet with MOB in private. MOB was polite, forthcoming, and receptive to meeting with CSW.   CSW asked about MOB's  Limited PNC and MOB reported having transportation barriers.  MOB stated that MOB was not aware about Medicaid Transportation.  CSW provided MOB with contact information and explained the application process. CSW encouraged MOB to call tomorrow to schedule an appointment; MOB agreed. MOB denied having any other psychosocial stressors. MOB shared that she has a good support team that consists of her immediate family and FOB.  MOB also stated that she has all essential items need to parent infant.   MOB is aware of SIDS precautions.    MOB denies MH hx and was attentive to information and resources given regarding PMADs.  She states  no questions, concerns or needs at this time.  CSW inquired about MOB's marijuana use.  MOB reported MOB's last use was sometime during [facility] October 2019. MOB stated, "I smoked to increase my appetite because I was losing too much weight." CSW made MOB aware of hospital's drug screen policy. MOB is aware that infant's UDS is positive for [facility] and CSW is going to make a report to [place]. MOB denied CPS hx and CSW explained CPS process. MOB did not have any questions or concerns.   CPS report was made with [place] [facility].  CSW Plan/Description:  No Further Intervention Required/No Barriers to Discharge, Sudden Infant Death Syndrome (SIDS) Education, Perinatal Mood and Anxiety Disorder (PMADs) Education, Other Information/Referral to [employer], Child Copy Report ,  CSW Will Continue to Monitor Umbilical Cord Tissue Drug Screen Results and Make Report if Warranted   Kelly Willis, MSW, LCSW Clinical Social Work [phone]   Dimple Nanas, LCSW 01/04/2018, 11:40 AM

## 2018-01-05 MED ORDER — IBUPROFEN 600 MG PO TABS: 600.0000 mg | ORAL_TABLET | Freq: Four times a day (QID) | ORAL | 0 refills | Status: DC

## 2018-01-05 NOTE — Discharge Summary (Signed)
Obstetrics Discharge Summary OB/GYN Faculty Practice   Patient Name: Kelly Willis DOB: 1995-09-14 MRN: [id number]  Date of admission: 01/03/2018 Delivering MD: Jeralene HuffHOLLIDAY, ASHLEY B   Date of discharge: 01/05/2018  Admitting diagnosis: 38wks, contractions Intrauterine pregnancy: 5553w5d     Secondary diagnosis:   Active Problems:   Anti-M isoimmunization affecting pregnancy, antepartum   History of gestational diabetes mellitus   Limited prenatal care, antepartum   Indication for care in labor or delivery    Discharge diagnosis: Term Pregnancy Delivered                                            Postpartum procedures: None  Complications: precipitous delivery  Outpatient Follow-Up: [ ]  PP IUD  Hospital course: Kelly Willis is a 22 y.o. 3153w5d who was admitted for spontaneous onset of labor. Her pregnancy was complicated by above noted items. Her labor course was uncomplicated. Delivery was complicated by precipitous delivery without clinician attendance. Please see delivery/op note for additional details. Her postpartum course was uncomplicated. She was breastfeeding and formula feeding. By day of discharge, she was passing flatus, urinating, eating and drinking without difficulty. Her pain was well-controlled, and she was discharged home with ibuprofen. She will follow-up in clinic in 4-6 weeks for postpartum visit.   Physical exam  Vitals:   01/04/18 1410 01/04/18 1945 01/04/18 2203 01/05/18 0531  BP: 115/60 136/86 127/81 104/70  Pulse: 87 86 88 89  Resp: 18 18  16   Temp: 98.6 F (37 C) 98.3 F (36.8 C) 97.9 F (36.6 C) 97.7 F (36.5 C)  TempSrc: Oral Oral Oral Oral  SpO2: 100% 100% 100% 100%   General: well-appearing, NAD  Lochia: appropriate Uterine Fundus: firm Incision: N/A DVT Evaluation: No significant calf/ankle edema. Labs: Lab Results  Component Value Date   WBC 10.2 01/03/2018   HGB 11.6 (L) 01/03/2018   HCT 36.7 01/03/2018   MCV 84.8 01/03/2018   PLT  266 01/03/2018   CMP Latest Ref Rng & Units 11/02/2017  Glucose 70 - 99 mg/dL 99  BUN 6 - 20 mg/dL 8  Creatinine 4.090.44 - 8.111.00 mg/dL 9.140.61  Sodium 782135 - 956145 mmol/L 135  Potassium 3.5 - 5.1 mmol/L 3.7  Chloride 98 - 111 mmol/L 105  CO2 22 - 32 mmol/L 22  Calcium 8.9 - 10.3 mg/dL 9.1  Total Protein 6.5 - 8.1 g/dL 7.0  Total Bilirubin 0.3 - 1.2 mg/dL 0.8  Alkaline Phos 38 - 126 U/L 134(H)  AST 15 - 41 U/L 18  ALT 0 - 44 U/L 16    Discharge instructions: Per After Visit Summary and "Baby and Me Booklet"  After visit meds:  Allergies as of 01/05/2018      Reactions   Pepto-bismol [bismuth Subsalicylate] Nausea And Vomiting      Medication List    STOP taking these medications   ondansetron 4 MG disintegrating tablet Commonly known as:  ZOFRAN-ODT     TAKE these medications   ferrous sulfate 325 (65 FE) MG tablet Take 1 tablet (325 mg total) by mouth 2 (two) times daily with a meal.   ibuprofen 600 MG tablet Commonly known as:  ADVIL,MOTRIN Take 1 tablet (600 mg total) by mouth every 6 (six) hours.   PRENATAL 27-1 MG Tabs Take 1 tablet by mouth daily.       Postpartum contraception: IUD Diet: Routine Diet Activity:  Advance as tolerated. Pelvic rest for 6 weeks.   Follow-up Appt:No future appointments. Follow-up Visit:No follow-ups on file.  Newborn Data: Live born female  Birth Weight: 6 lb 7.4 oz (2931 g) APGAR: 9, 9  Newborn Delivery   Birth date/time:  01/03/2018 18:12:00 Delivery type:  Vaginal, Spontaneous    Baby Feeding: Both breast and bottle feeding Disposition:home with mother  Cristal Deer. Earlene Plater, DO OB/GYN Fellow, Faculty Practice

## 2018-01-05 NOTE — Progress Notes (Signed)
CSW acknowledged consult for edinburgh score 15 and hx of anxiety. CSW completed full psychosocial assessment on MOB on 01/04/18 and provided Perinatal Mood and Anxiety Disorder (PMADs) Education/resources. CSW signing off, please reconsult if new needs arise.   No barriers to discharge.   Deane Wattenbarger, LCSWA Clinical Social Worker [facility] Cell#: [phone] 

## 2018-01-06 LAB — BPAM RBC
Blood Product Expiration Date: 201912252359
UNIT TYPE AND RH: 5100

## 2018-01-06 LAB — TYPE AND SCREEN
ABO/RH(D): O POS
Antibody Screen: POSITIVE
Donor AG Type: NEGATIVE
PT AG TYPE: NEGATIVE
UNIT DIVISION: 0

## 2018-02-12 ENCOUNTER — Ambulatory Visit: Payer: 59 | Admitting: Obstetrics and Gynecology

## 2018-02-19 ENCOUNTER — Ambulatory Visit: Payer: 59 | Admitting: Family Medicine

## 2018-05-19 ENCOUNTER — Encounter: Payer: Self-pay | Admitting: *Deleted

## 2019-02-12 NOTE — L&D Delivery Note (Addendum)
OB/GYN Faculty Practice Delivery Note  Kelly Willis is a 24 y.o. [id number] s/p SVD at [redacted]w[redacted]d. She was admitted for SROM and SOL.   ROM: 5h 65m with clear fluid GBS Status:  Negative/-- (10/20 1415) Maximum Maternal Temperature: 29 F  Labor Progress: Patient presented to L&D for SROM. Initial SVE: 2.5/80/-2. Labor course was uncomplicated and rapidly progressed. She then progressed to complete.   Delivery Date/Time: 15:52 Delivery: Called to room and patient was complete and pushing. Head position was LOA and delivered with ease over the perineum. No nuchal cord present. Shoulder and body delivered in usual fashion. Infant with spontaneous cry, placed on mother's abdomen, dried and stimulated. Cord clamped x 2 after 1-minute delay, and cut by FOB. Cord blood drawn. Placenta delivered spontaneously with gentle cord traction. Fundus firm with massage and pitocin started. Labia, perineum, vagina, and cervix inspected and significant for Small hemostatic first degree perineal tear .  Baby Weight: @BABYWGTOZEBC @ pending  Cord: central insertion, 3 vessel Placenta: Sent to L&D Complications: elevated BP on admission not requiring anti-hypertensives. Lacerations: Small hemostatic first degree perineal tear which was well approximated and therefore not repaired., and was repaired in the standard fashion EBL: 50cc Analgesia: Epidural   Infant: APGAR (1 MIN): 9  APGAR (5 MINS): 9  , MD PGY-1 OBGYN Faculty Teaching Service  12/08/2019, 6:29 PM    I attest that I was gowned and gloved for the entirety of the delivery of infant and placenta and I agree with above documentation and findings.  12/10/2019

## 2019-06-04 IMAGING — CR DG FOOT COMPLETE 3+V*L*
3 series · 3 of 3 positions shown · non-contrast
Comparison: No priors.

CLINICAL DATA: 21-year-old female with throbbing pain in the left
foot.

EXAM:
LEFT FOOT - COMPLETE 3+ VIEW

[t foot ap left]
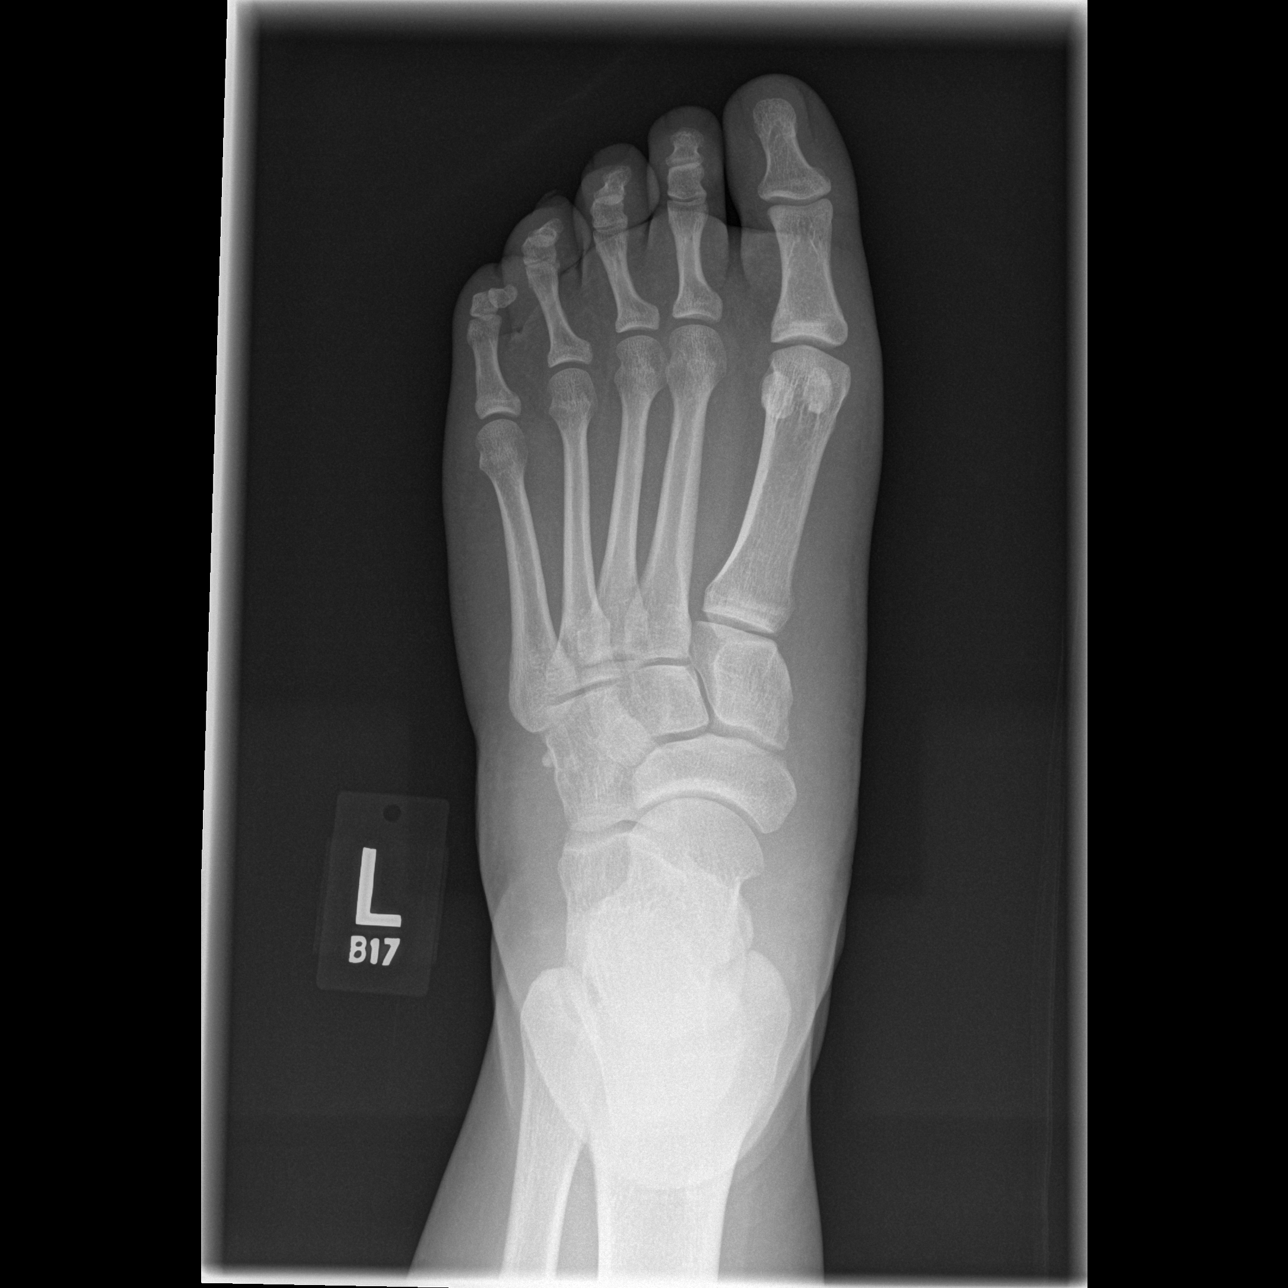

[t foot oblique left]
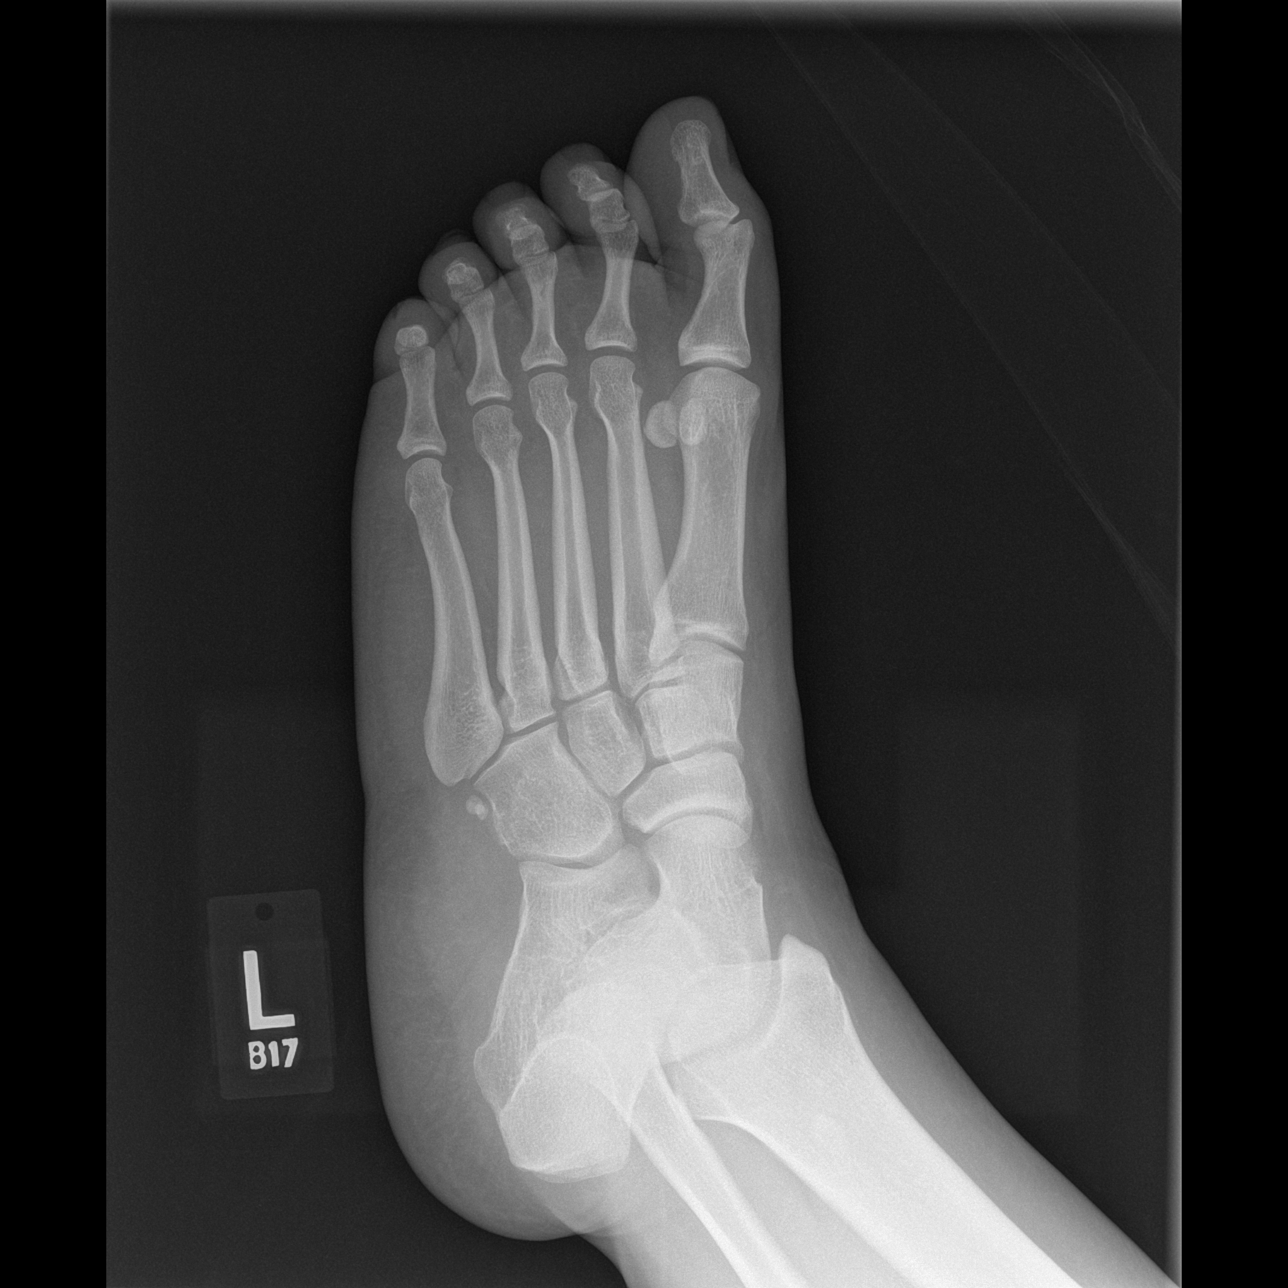

[t foot lat left]
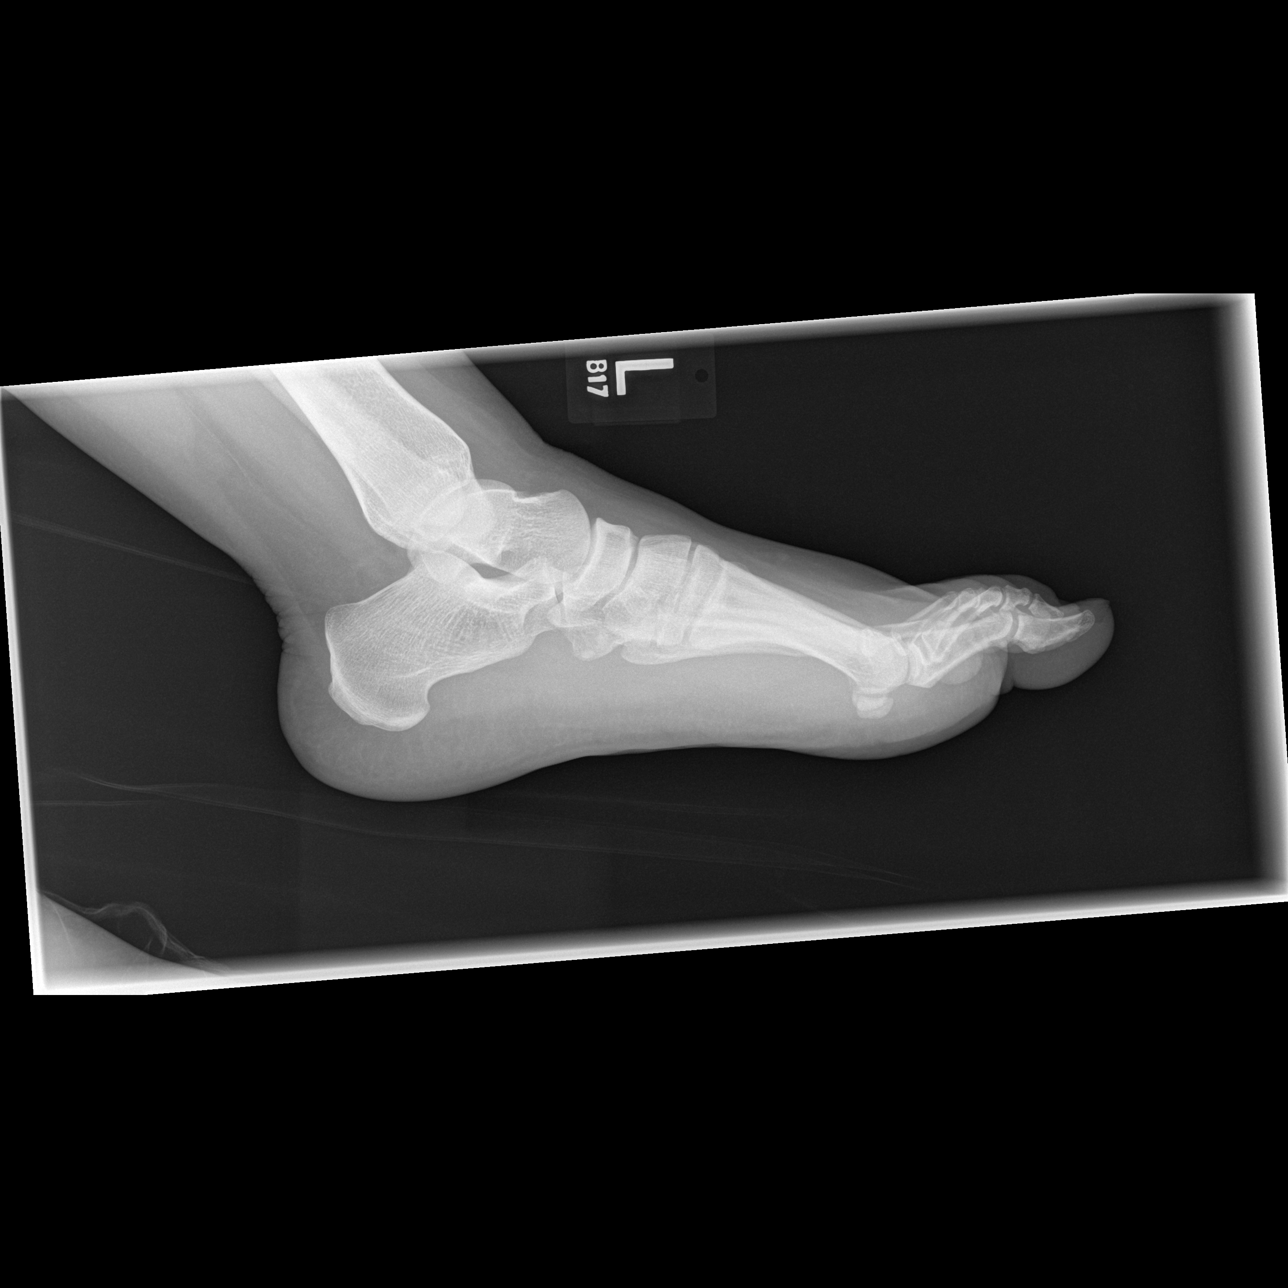

[3 of 3 positions shown; findings below may reference images not displayed]

FINDINGS: There is no evidence of fracture or dislocation. There is no
evidence of arthropathy or other focal bone abnormality. Soft
tissues are unremarkable.
IMPRESSION: Negative.

## 2019-06-07 ENCOUNTER — Ambulatory Visit: Payer: 59

## 2019-06-29 ENCOUNTER — Ambulatory Visit: Payer: 59

## 2019-07-07 ENCOUNTER — Other Ambulatory Visit: Payer: Self-pay

## 2019-07-07 ENCOUNTER — Ambulatory Visit (INDEPENDENT_AMBULATORY_CARE_PROVIDER_SITE_OTHER): Payer: 59 | Admitting: *Deleted

## 2019-07-07 DIAGNOSIS — O99210 Obesity complicating pregnancy, unspecified trimester: Secondary | ICD-10-CM

## 2019-07-07 DIAGNOSIS — Z3490 Encounter for supervision of normal pregnancy, unspecified, unspecified trimester: Secondary | ICD-10-CM

## 2019-07-07 DIAGNOSIS — O0990 Supervision of high risk pregnancy, unspecified, unspecified trimester: Secondary | ICD-10-CM | POA: Insufficient documentation

## 2019-07-07 DIAGNOSIS — F32A Depression, unspecified: Secondary | ICD-10-CM

## 2019-07-07 DIAGNOSIS — O99340 Other mental disorders complicating pregnancy, unspecified trimester: Secondary | ICD-10-CM

## 2019-07-07 DIAGNOSIS — F329 Major depressive disorder, single episode, unspecified: Secondary | ICD-10-CM

## 2019-07-07 DIAGNOSIS — Z3A00 Weeks of gestation of pregnancy not specified: Secondary | ICD-10-CM

## 2019-07-07 DIAGNOSIS — E669 Obesity, unspecified: Secondary | ICD-10-CM | POA: Insufficient documentation

## 2019-07-07 DIAGNOSIS — O361990 Maternal care for other isoimmunization, unspecified trimester, not applicable or unspecified: Secondary | ICD-10-CM

## 2019-07-07 MED ORDER — BLOOD PRESSURE KIT DEVI: 1.0000 | 0 refills | Status: DC | PRN

## 2019-07-07 NOTE — Patient Instructions (Signed)
[place] [employer]  Department of Social Services[place] [address], [place], [place] [address] [phone]   or  www.[place].gov/[place]/human-services/social-services **SNAP/EBT/ Other nutritional benefits  [place] DHHS-Public Health-WIC [address], [place], [place] [address] [phone]  or  https://[place].gov/[place]/human-services/health-department **WIC for  women who are pregnant and postpartum, infants and children up to 5 years old  [facility] [employer] [facility] [address], [place], [place] [address] [phone]   or   www.theblessedtable.org  **Food pantry  [employer] [address], [place], [place] [address] [phone]   or   https://brotherkolbes.godaddysites.com  **Emergency food and prepared meals  [facility] [employer] [facility] [address], [place], [place] [address] [phone]   or   www.cedargrovetop.us **Food pantry  [facility] [address], [place], [place] [address] [phone]   or   www.facebook.com/[facility]-116430931718202 **Food pantry  [facility] [address], [place], [place] [address] [phone] **Food pantry  [facility] [address], [place], [place] [address] [phone]   or   www.greensborourbanministry.org  **Food pantry and prepared meals  [facility] [address], [place], [place] [address] https://jfsgreensboro.org/  **Food pantry  [facility] [employer] [facility] [address], [place], [place] [address] [phone]   or   www.lbcnow.org  **Food pantry  [facility] [employer] [facility] [address], [place], [place] [address] [phone]   or   http://www.onestepfurther.com **Food pantry, nutrition education, gardening activities  [employer] [employer] [facility] [address], [place], [place] [address] [phone] **Food pantry  [facility] [place] [address], [place], [place] [address] [phone]   or   www.salvationarmyofgreensboro.org **Food pantry  [facility] [place] [address], [place], [place] [address] [phone]   or   http://senior-resources-[place].org **Meals on Wheels Program  [facility] [address], [place], [place] [address] [phone]   or   www.stmattchurch.com  **Food pantry  [facility] [address], , [place] [address] [phone]   or   vandaliapresbyterianchurch.org **Food pantry  [place] [employer]  [place] [employer] [facility] [address], [place], [place] [address] [phone]   or   www.facebook.com/JulianUMC Food pantry  [facility] [employer] [facility] [address], [place], [place] [address] [phone]) [phone] **Food pantry  [place] [facility] [employer] [facility]   Department of [place] Health[place] [address], [place], [place] [address] [phone]   or   www.co.[place].Bol[place]ee.us/ph/  [place] DHHS-Public Health-WIC ([place]) [address], [place], [place] [address] [phone]   or   https://www.[place].gov/[place]/human-services/health-department **WIC for pregnant and postpartum women, infants and children up to 5 years old  [facility] [address], [place], [place] [address] [phone]) [phone] **Food pantry  [facility] [address] [place] [address], [place], [place] [address] [phone]   or   emerywoodbaptistchurch.com *Food pantry  [employer] [facility] [address], [place], [place] [address] [phone]   or   www.fcch[place].org **Food pantry  [facility] [address], [place],  [address] [phone]) [phone]   or    www.helpinghandshp.org **Food pantry  [employer] [facility] [address], [place],  [address] [phone]   or   www.facebook.com/KBCI1 **Food Pantry  [employer] [facility] [address] [facility]   [address], [place], [place] [address] [phone]   or   www.abbottscreek.org [employer] [place] [phone]   **Delivers meals  [facility] [address] [place], [place] [address] [phone]   or   nbfgm.sundaystreamwebsites.com  [employer] [facility] [employer] [address], [place], [place] [address] [phone]   or   www.odm-hp.org  **Food pantry  [address] [place] [facility] [address], [place], [place] [address] [phone]   or   R2live.tv **Food pantry  [facility] [address] [place] [address], [place], [place] [address] [phone]   or   WrestlingMonthly.pl **Emergency food and pet food  [facility] [address], [place], [place] [address] [phone]   or   www.senioradults.org **Congregate and delivered meals to older adults

## 2019-07-07 NOTE — Progress Notes (Signed)
I connected with  Kelly Willis on 07/07/19 at  2:15 PM EDT by telephone and verified that I am speaking with the correct person using two identifiers.   I discussed the limitations, risks, security and privacy concerns of performing an evaluation and management service by telephone and the availability of in person appointments. I also discussed with the patient that there may be a patient responsible charge related to this service. The patient expressed understanding and agreed to proceed.  I explained I am completing her New OB Intake today. We discussed Her EDD and that it is based on  sure LMP . I reviewed her allergies, meds, OB History, Medical /Surgical history, and appropriate screenings. I informed her of [facility] services. She has a + PHQ 9 and I offered her a referral to [facility] which she accepted. I explained registrar will contact her with an appointment. She has has some food insecurity and I informed her I will put food resources into her AVS.  She is G3  low risk with history of GDM.    I explained I will send her the Babyscripts app and app was sent to her while on phone.   I explained we will send a blood pressure cuff to [facility] that will fill that prescription and we called [facility] to verify they received her prescription and confirmed  That it is not covered by her insurance. She will purchase a cuff from store of her choice.   I asked her to bring the blood pressure cuff with her to her first ob appointment so we can show her how to use it. Explained  then we will have her take her blood pressure weekly and enter into the app.  I explained she will have some visits in office and some virtually. She already has MyChart and may downloadt he  App.  I reviewed her new ob  appointment date/ time with her , our location and to wear mask, no visitors.  I explained she will have a pelvic exam, ob bloodwork, hemoglobin a1C, cbg ,pap, and  genetic testing if desired,- she does want a  panorama. I scheduled an [place] at 19 weeks and gave her the appointment. She voices understanding.   Chaston Bradburn,RN 07/07/2019  3:05

## 2019-07-08 NOTE — BH Specialist Note (Signed)
Integrated Behavioral Health Initial Visit  MRN: [id number] Name: Kelly Willis  Number of Integrated Behavioral Health Clinician visits:: 1/6 Session Start time: 8:29  Session End time: 9:30 Total time: 55  Type of Service: Integrated Behavioral Health- Individual/Family Interpretor:No. Interpretor Name and Language: n/a   Warm Hand Off Completed.       SUBJECTIVE: Kelly Willis is a 24 y.o. female accompanied by N/A Patient was referred by Leroy Libman, MD for positive depression screen. Patient reports the following symptoms/concerns: Pt states her primary symptoms are poor appetite, feeling bad about herself, worry, irritability, fear, followed by sleep difficulty, anxiety, restlessness, and passive SI, with no intent and no plan. Pt open to talk to medical provider at next visit about medication, and learning self-coping strategy to use to use instead of marijuana to cope with anxiety/stress. Pt has a goal of finding housing with more space before baby arrives.  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 Reduce symptoms of: anxiety, depression and stress 2. Increase knowledge and/or ability of: healthy habits, self-management skills and stress reduction  3. Demonstrate ability to: Increase healthy adjustment to current life circumstances and Decrease self-medicating behaviors  INTERVENTIONS: Interventions utilized: Solution-Focused Strategies, Mindfulness or Management consultant and Psychoeducation and/or Health Education   Standardized Assessments completed: C-SSRS Short, GAD-7 and PHQ 9  ASSESSMENT: Patient currently experiencing Adjustment disorder with mixed anxiety and depression and Psychosocial stress   Patient may benefit from psychoeducation and brief therapeutic interventions regarding coping with symptoms of anxiety, depression, and life stress .  PLAN: 1. Follow up with behavioral health clinician on : Two weeks 2. Behavioral recommendations: -Follow Safety Plan, as discussed, if SI thoughts return -CALM relaxation breathing exercise twice daily (morning; at bedtime with sleep sounds) Chief Operating Officer of new [facility] at www.conehealthybaby.com -Consider apps, as discussed, for additional self-care -Consider contacting housing resources (on After Visit Summary) -Sign [id number] consent sent to phone, for [facility] referral  3. Referral(s): Integrated [employer] (In Clinic) and [employer]:  Housing  [address] [place], [place]  Depression screen [facility] 2/9 07/16/2019 07/07/2019 07/16/2017  Decreased Interest 1 3 1   Down, Depressed, Hopeless 1 1 2   PHQ - 2 Score 2 4 3   Altered sleeping 2 3 2   Tired, decreased energy 1 1 1   Change in appetite 3 3 3   Feeling bad or failure about yourself  3 1 1   Trouble concentrating 0 0 2  Moving slowly or fidgety/restless 2 0 0  Suicidal thoughts 2 0 0  PHQ-9 Score 15 12 12    GAD 7 : Generalized Anxiety Score 07/16/2019 07/07/2019 07/16/2017  Nervous, Anxious, on Edge 2 0 1  Control/stop worrying 2 0 2  Worry too much - different things 3 0 3  Trouble relaxing 1 2 1   Restless 2 1 0  Easily annoyed or irritable 3 3 3   Afraid - awful might happen 3 0 0  Total GAD 7 Score 16 6 10

## 2019-07-08 NOTE — Progress Notes (Signed)
Chart reviewed for nurse visit. Agree with plan of care.   Marny Lowenstein, PA-C 07/08/2019 3:34 PM

## 2019-07-14 ENCOUNTER — Encounter: Payer: 59 | Admitting: Medical

## 2019-07-16 ENCOUNTER — Ambulatory Visit (INDEPENDENT_AMBULATORY_CARE_PROVIDER_SITE_OTHER): Payer: 59 | Admitting: Clinical

## 2019-07-16 DIAGNOSIS — Z658 Other specified problems related to psychosocial circumstances: Secondary | ICD-10-CM | POA: Diagnosis not present

## 2019-07-16 DIAGNOSIS — F4323 Adjustment disorder with mixed anxiety and depressed mood: Secondary | ICD-10-CM

## 2019-07-16 NOTE — Patient Instructions (Addendum)
Center for [facility] at [facility] [place], [place] [address] [phone] (main office) [phone] ([place] office)  Elysian and Websites Here are a few free apps meant to help you to help yourself.  To find, try searching on the internet to see if the app is offered on Apple/Android devices. If your first choice doesn't come up on your device, the good news is that there are many choices! Play around with different apps to see which ones are helpful to you.    Calm This is an app meant to help increase calm feelings. Includes info, strategies, and tools for tracking your feelings.      Calm Harm  This app is meant to help with self-harm. Provides many 5-minute or 15-min coping strategies for doing instead of hurting yourself.       Hugo is a problem-solving tool to help deal with emotions and cope with stress you encounter wherever you are.      MindShift This app can help people cope with anxiety. Rather than trying to avoid anxiety, you can make an important shift and face it.      MY3  MY3 features a support system, safety plan and resources with the goal of offering a tool to use in a time of need.       My Life My Voice  This mood journal offers a simple solution for tracking your thoughts, feelings and moods. Animated emoticons can help identify your mood.       Relax Melodies Designed to help with sleep, on this app you can mix sounds and meditations for relaxation.      Smiling Mind Smiling Mind is meditation made easy: it's a simple tool that helps put a smile on your mind.        Stop, Breathe & Think  A friendly, simple guide for people through meditations for mindfulness and compassion.  Stop, Breathe and Think Kids Enter your current feelings and choose a "mission" to help you cope. Offers videos for certain moods instead of just sound recordings.       Team  Orange The goal of this tool is to help teens change how they think, act, and react. This app helps you focus on your own good feelings and experiences.      The Ashland Box The Ashland Box (VHB) contains simple tools to help patients with coping, relaxation, distraction, and positive thinking.    Behavioral Health Resources:   What if I or someone I know is in crisis?  . If you are thinking about harming yourself or having thoughts of suicide, or if you know someone who is, seek help right away.  . Call your doctor or mental health care provider.  . Call 911 or go to a hospital emergency room to get immediate help, or ask a friend or family member to help you do these things.  . Call the [place] National Suicide Prevention Lifeline's toll-free, 24-hour hotline at 1-800-273-TALK (272)616-6162) or TTY: 1-800-799-4 TTY 208 183 0100) to talk to a trained counselor.  . If you are in crisis, make sure you are not left alone.   . If someone else is in crisis, make sure he or she is not left alone   24 Hour :   [place] National Suicide Hotline: 702-189-4367  Therapeutic Alternative Mobile Crisis: (236) 696-2536   [facility]  [address], [place], [place] [address]  [phone]  or [place] (Domestic Violence, Rape & Victim Assistance)  [phone]  [place]  [address], [place]  [address]   [phone] or [phone]   [place]: [phone] (8am-4pm) or [phone] (after hours)           [facility], [address] [place], [place] Fax: [phone] www.NailBuddies.ch  *Interpreters available *Accepts Medicaid, Medicare, uninsured  [facility]   Mon-Fri: 8am-5pm [address], [place], [place]  [phone](phone); [phone]) BloggerCourse.com  *Accepts Medicare  [facility] Osker Mason, Fri: 8am-4pm [address], [place], [place] (phone); [phone] (fax) TaskTown.es  *[place] Mon-Fri: 9am-5pm  [address] [place], [place] (phone); [phone]  https://www.bond-cox.org/  *Accepts Medicaid  [facility] [address] [place], [place]  SalonLookup.es   [facility] [place], 8:30am-12pm/1pm-2:30pm [address], [place], [place] (phone); [phone] (fax) www.fspcares.org  *Accepts Medicaid, sliding-scale*Bilingual services available  [facility] Mon-Fri, 8am-7pm [place], [place]  [phone](phone); ([phone]) www.famsolutions.org  *Accepts Medicaid *Bilingual services available  [facility] Mon-Fri: 8am-5pm, Saturday by appointment only [place], [place], [place] (phone); [phone] (fax) www.journeyscounselinggso.com   [facility] [address] [place], [place], [place] www.kellinfoundation.org  *Free & reduced services for uninsured and underinsured individuals *Bilingual services for Spanish-speaking clients 21 and under  [facility], [address] [place], [place]); [phone]) RunningConvention.de  *Bring your own interpreter at first visit *Accepts Medicare and [facility]  [place] Mon-Fri: 9am-5:30pm [address] [place], [place], [place] (phone), [phone] (fax) After hours crisis line: [phone] www.neuropsychcarecenter.com  *Accepts Medicare and  Medicaid  [employer], 8am-6pm [address] [place], [place] (phone); [phone] (fax) http://presbyteriancounseling.org  *Subsidized costs available  [facility] Mon-Fri: 8am-4pm [address] [place], [place] [phone](phone); [phone]) www.psychotherapeuticservices.com  *Accepts Medicaid  [facility] [place] Same day access hours: Mon-Fri, 8:30-3pm Crisis hours: Mon-Fri, 8am-5pm [place], [place] Same day access hours: Mon-Fri, 8:30-3pm Crisis hours: Mon-Fri, 8am-8pm [address], [place], [place] (phone); [phone] (fax) www.rhahealthservices.org  *Accepts Medicaid and Medicare  [facility] [place] Mon, [place], Fri: 9am-9pm Tues, Thurs: 9am-6pm [place], [place], [place] (phone); [phone] (fax) https://ringercenter.com  *(Accepts Medicare and Medicaid; payment plans available)*Bilingual services available  [facility] [address], [place], [place] (phone); [phone] (fax) www.santecounseling.[place] [address], [place], [place], [place]  OmahaConnections.com.pt  *Bilingual services available  [facility] (Social and Emotional Learning) Mon-Thurs: 8am-8pm [address], [place], [place], [place] (phone); [phone] (fax) LostMillions.com.pt  *Accepts Medicaid*Bilingual services available  [place] [address], [place], [place], [place] (phone) DeadConnect.com.cy  *Accepts Medicaid *Bilingual services available  [facility] Mon-Fri, 9am-4:45pm [address], [place], [place] (phone); [phone] (fax) http://tlc-counseling.com  *Accepts Medicare  [facility] Mon-Thurs: 8:30-8pm,  Fri: 8:30am-7pm [address], [place], [place] (3rd floor) [phone] (phone); [phone] (fax) VIPinterview.si  *Accepts Medicaid; income-based reduced rates available  [facility] Mon-Fri: 8am-5pm [address], [place], [place], [place] (phone); [phone] (fax) http://www.wrightscareservices.com  *Accepts Medicaid*Bilingual services available  [facility] [address], [place], [place]  [phone] (phone); [phone] (fax) www.youthfocus.org  *Free emergency housing and clinical services for youth in crisis  [facility] (Mental Health Association of [place])  [address], [place] [phone] www.mhag.org  *Provides direct services to individuals in recovery from mental illness, including support groups, recovery skills classes, and one on one peer support  [employer] on Mental Illness) [place] helpline: ([phone])172-7531  https://namiguilford.org  *A community hub for information relating to local resources and services for the friends and families of individuals living alongside a mental health condition, as well as the individuals themselves. Classes and support groups also provided    Housing Resources                    [employer] (serves [place], [place], [place], [place], [place], [place], [place], [place], [place], [place], [place], [place], and [place] counties) [address] [place], [place] [address] [phone] DeveloperU.ch  **Rental assistance, Home Rehabilitation,Weatherization Assistance Program, Chief Financial Officer, Housing Voucher Program   Housing Resources [facility]  Housing Authority- [place] [address], [place], [place] [address] [phone] www.gha-[place].[facility] [address] [place], [place] [address] [phone] PhoneCaptions.ch **Programs include: Hospital doctor and Housing Counseling, Healthy Radiographer, therapeutic, Homeless Prevention and Housing Assistance  Government [facility] [address], [place], [place] [address] [phone] www.PaintingEmporium.co.za **housing applications/recertification; tax payment relief/exemption under specific qualifications  [facility] [address], [place], [place] [address] www.onlinegreensboro.com/~maryshouse **transitional housing for women in recovery who have minor children or are pregnant  [facility] [address] [place], [place], [place] [address] https://johnson-smith.net/  **emergency shelter and support services for families facing homelessness  [facility] [address] [place], [place] [address] [phone] www.youthfocus.org **transitional housing to pregnant women; emergency housing for youth who have run away, are experiencing a family crisis, are victims of abuse or neglect, or are homeless  [facility] [address] [place], [place] [address] [phone] ircgso.org **Drop-in center for people experiencing homelessness; overnight warming center when temperature is 25 degrees or below  [facility] [address] [place], [place], [place] [address] [phone] https://reentrystaffingagency.org/ **help with affordable housing to people experiencing homelessness or unemployment due to incarceration  [facility] [address] [place], [place] [address] [phone] www.greensborourbanministry.org  **emergency and transitional housing, rent/mortgage assistance, utility assistance  [facility] [address] [place], [place] [address] [phone] www.salvationarmyofgreensboro.org **emergency and transitional housing  Habitat for [employer] [address], [place], [place] [address] [phone] Www.habitatgreensboro.[facility]   [employer] [address], [place], [place] [address] [phone] https://chshousing.org **Home Ownership/Affordable Housing Program and [facility]  [facility] [address], [place], [place] [address] [phone] PaidValue.com.cy **home buyer education courses, foreclosure prevention  [facility] [address] [place], [place] [address] [phone] DefMagazine.is **Environmental Exposure Assessment (investigation of homes where either children or pregnant women with a confirmed elevated blood lead level reside)  [facility] [address] [place], [place] [address] [phone] ShowReturn.ca **Home Expense Assistance/Repairs Program; offers home accessibility updates, such as ramps or bars in the bathroom  [facility]Help [facility] [address], [place], [place] [address] [phone] https://www.self-help.org/locations/[place]-branch **Offers credit-building and banking services to people unable to use traditional banking   Housing Resources [employer]  [place] of [employer] [place], [place], [place] [address] [phone] RoulettePays.com.br   [facility] [address], [place], [place] [address] [phone]  ClubMonetize.fr **housing applications/recertification, emergency and transitional housing  Open [employer] of [employer] [address] [place], [place] [address] [phone] www.odm-hp.org  **emergency and permanent housing; rent/mortgage payment assistance  Habitat for [employer], [place] and [place] [address], [place], [place] [address] [phone] ArchitectReviews.com.au  [facility] Services of the  [place], [place], [place], [place] [address] www.familyservice-piedmont.org **emergency shelter for victims of domestic violence and sexual assault  [facility] [address] [place], [place] [address] [phone] www.senior-resources-guilford.org **Home expense assistance/repairs for older [place] of [place], [place], [place] [address] [phone] NameDisc.is  **May offer help with minor housing repairs  [facility] [address] [place], [place] [address] [phone] **emergency housing for victims of domestic violence  Housing Resources [place], [place], [place] [facility])  [facility] [address] [place], [place] [address] [phone] www.newrha.[facility] [address] [place], [place] [address] ([place] [address] [place], [place], [place] [address] [phone] www.co.[place].us **Housing applications/recertification; tax payment relief/exemption w specific qualifications  [facility] for Homeless [address], [place], [place] [address] [phone] Http://www.rchelpforhomeless.org/HOME_PAGE.html  HELP, [place] [address] [place], [place] [address] [phone] [place] [phone] Hours of Operation: Monday-Friday, 8:30am-5:00pm http://helpincorporated.org **Includes emergency housing for victims of domestic violence

## 2019-07-20 NOTE — BH Specialist Note (Signed)
Pt did not arrive to video visit and did not answer the phone ; Left HIPPA-compliant message to call back Asher Muir from Center for [employer] at [facility] at [phone] (main office) or [phone] (Kayline Sheer's office).  ; left MyChart message for patient.    Integrated Behavioral Health via Telemedicine Video Visit  07/20/2019 Deziree Mokry [phone]  Rae Lips

## 2019-07-28 ENCOUNTER — Other Ambulatory Visit: Payer: Self-pay

## 2019-07-28 ENCOUNTER — Ambulatory Visit: Payer: 59 | Admitting: *Deleted

## 2019-07-28 ENCOUNTER — Other Ambulatory Visit: Payer: Self-pay | Admitting: *Deleted

## 2019-07-28 ENCOUNTER — Ambulatory Visit: Payer: 59 | Attending: Obstetrics and Gynecology

## 2019-07-28 DIAGNOSIS — O99212 Obesity complicating pregnancy, second trimester: Secondary | ICD-10-CM | POA: Diagnosis not present

## 2019-07-28 DIAGNOSIS — O361990 Maternal care for other isoimmunization, unspecified trimester, not applicable or unspecified: Secondary | ICD-10-CM

## 2019-07-28 DIAGNOSIS — O361912 Maternal care for other isoimmunization, first trimester, fetus 2: Secondary | ICD-10-CM | POA: Diagnosis not present

## 2019-07-28 DIAGNOSIS — O09292 Supervision of pregnancy with other poor reproductive or obstetric history, second trimester: Secondary | ICD-10-CM | POA: Diagnosis not present

## 2019-07-28 DIAGNOSIS — O99210 Obesity complicating pregnancy, unspecified trimester: Secondary | ICD-10-CM

## 2019-07-28 DIAGNOSIS — Z3A20 20 weeks gestation of pregnancy: Secondary | ICD-10-CM

## 2019-07-28 DIAGNOSIS — E669 Obesity, unspecified: Secondary | ICD-10-CM

## 2019-07-28 DIAGNOSIS — Z3490 Encounter for supervision of normal pregnancy, unspecified, unspecified trimester: Secondary | ICD-10-CM | POA: Insufficient documentation

## 2019-07-28 DIAGNOSIS — Z363 Encounter for antenatal screening for malformations: Secondary | ICD-10-CM | POA: Diagnosis not present

## 2019-07-30 ENCOUNTER — Ambulatory Visit: Payer: 59 | Admitting: Clinical

## 2019-07-30 DIAGNOSIS — Z91199 Patient's noncompliance with other medical treatment and regimen due to unspecified reason: Secondary | ICD-10-CM

## 2019-08-04 ENCOUNTER — Encounter: Payer: 59 | Admitting: Obstetrics and Gynecology

## 2019-08-20 ENCOUNTER — Other Ambulatory Visit (HOSPITAL_COMMUNITY)
Admission: RE | Admit: 2019-08-20 | Discharge: 2019-08-20 | Disposition: A | Discharge status: Home / Self Care | Payer: 59 | Source: Ambulatory Visit | Attending: Obstetrics & Gynecology | Admitting: Obstetrics & Gynecology

## 2019-08-20 ENCOUNTER — Other Ambulatory Visit: Payer: Self-pay

## 2019-08-20 ENCOUNTER — Encounter: Payer: Self-pay | Admitting: Obstetrics & Gynecology

## 2019-08-20 ENCOUNTER — Ambulatory Visit (INDEPENDENT_AMBULATORY_CARE_PROVIDER_SITE_OTHER): Payer: 59 | Admitting: Obstetrics & Gynecology

## 2019-08-20 VITALS — BP 117/79 | HR 92 | Wt 331.6 lb

## 2019-08-20 DIAGNOSIS — Z3492 Encounter for supervision of normal pregnancy, unspecified, second trimester: Secondary | ICD-10-CM | POA: Insufficient documentation

## 2019-08-20 DIAGNOSIS — Z8632 Personal history of gestational diabetes: Secondary | ICD-10-CM

## 2019-08-20 DIAGNOSIS — B354 Tinea corporis: Secondary | ICD-10-CM

## 2019-08-20 DIAGNOSIS — N649 Disorder of breast, unspecified: Secondary | ICD-10-CM

## 2019-08-20 DIAGNOSIS — D563 Thalassemia minor: Secondary | ICD-10-CM

## 2019-08-20 DIAGNOSIS — L988 Other specified disorders of the skin and subcutaneous tissue: Secondary | ICD-10-CM

## 2019-08-20 DIAGNOSIS — O0990 Supervision of high risk pregnancy, unspecified, unspecified trimester: Secondary | ICD-10-CM

## 2019-08-20 DIAGNOSIS — O99210 Obesity complicating pregnancy, unspecified trimester: Secondary | ICD-10-CM

## 2019-08-20 MED ORDER — MICONAZOLE NITRATE 2 % EX CREA: 1.0000 "application " | TOPICAL_CREAM | Freq: Two times a day (BID) | CUTANEOUS | 0 refills | Status: DC

## 2019-08-20 NOTE — Progress Notes (Signed)
  Subjective:late prenatal care    Kelly Willis is a [name] [redacted]w[redacted]d being seen today for her first obstetrical visit.  Her obstetrical history is significant for obesity, h/o GDM. Patient does intend to breast feed. Pregnancy history fully reviewed.  Patient reports pruritic rash under breasts.  Vitals:   08/20/19 0956  BP: 117/79  Pulse: 92  Weight: (!) 331 lb 9.6 oz (150.4 kg)    HISTORY: OB History  Gravida Para Term Preterm AB Living  3 2 2  0 0 2  SAB TAB Ectopic Multiple Live Births  0 0 0 0 2    # Outcome Date GA Lbr Len/2nd Weight Sex Delivery Anes PTL Lv  3 Current           2 Term 01/03/18 [redacted]w[redacted]d 07:42 6 lb 7.4 oz (2.931 kg) M Vag-Spont None  LIV  1 Term 01/21/16 [redacted]w[redacted]d 11:38 / 00:09 6 lb 15.8 oz (3.17 kg) F Vag-Spont EPI  LIV     Birth Comments: GDM   Past Medical History:  Diagnosis Date  . ADHD (attention deficit hyperactivity disorder)   . Gestational diabetes   . Obesity   . Pilonidal cyst 06/2014   Past Surgical History:  Procedure Laterality Date  . NO PAST SURGERIES    . PILONIDAL CYST EXCISION    . PILONIDAL CYST EXCISION N/A 07/08/2014   Procedure: CYST EXCISION PILONIDAL ;  Surgeon: 07/10/2014, MD;  Location: [facility];  Service: General;  Laterality: N/A;   Family History  Problem Relation Age of Onset  . 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    Neurologic: oriented, normal mood   Extremities: normal strength, tone, and muscle mass   HEENT PERRLA and neck supple with midline trachea   Mouth/Teeth mucous membranes moist, pharynx normal without lesions and dental hygiene good   Neck  supple and no masses   Cardiovascular: regular rate and rhythm, no murmurs or gallops   Respiratory:  appears well, vitals normal, no respiratory distress, acyanotic, normal RR, neck free of mass or lymphadenopathy, chest clear, no wheezing, crepitations, rhonchi, normal symmetric air entry   Abdomen: obese and gravid 24 weeks   Urinary: urethral meatus normal      Assessment:    Pregnancy: Kelly Willis Patient Active Problem List   Diagnosis Date Noted  . Supervision of high risk pregnancy, antepartum 07/07/2019  . Obesity in pregnancy   . History of gestational diabetes mellitus 10/20/2017  . Lesion of skin of breast 10/20/2017  . Limited prenatal care, antepartum 10/20/2017  . Weight loss 10/20/2017  . Pilonidal abscess 10/16/2012        Plan:     Initial labs drawn. Prenatal vitamins. Problem list reviewed and updated. Genetic Screening discussed ordered  Ultrasound discussed; fetal survey: reviewed  and has f/u scheduled.  Follow up in 4 weeks. 50% of 30 min visit spent on counseling and coordination of care.  Hb A1C today, 2 hr NV Monistat derm ordered   12/16/2012 08/20/2019

## 2019-08-20 NOTE — Patient Instructions (Signed)

## 2019-08-20 NOTE — Progress Notes (Signed)
  Pt has rash under breast.

## 2019-08-21 LAB — PROTEIN / CREATININE RATIO, URINE
Creatinine, Urine: 135.8 mg/dL
Protein, Ur: 15.7 mg/dL
Protein/Creat Ratio: 116 mg/g creat (ref 0–200)

## 2019-08-22 LAB — CULTURE, OB URINE

## 2019-08-22 LAB — URINE CULTURE, OB REFLEX

## 2019-08-24 LAB — AB SCR+ANTIBODY ID: Antibody Screen: POSITIVE — AB

## 2019-08-24 LAB — CBC/D/PLT+RPR+RH+ABO+RUB AB...
Basophils Absolute: 0 10*3/uL (ref 0.0–0.2)
Basos: 0 %
EOS (ABSOLUTE): 0.1 10*3/uL (ref 0.0–0.4)
Eos: 1 %
HCV Ab: <0.1 s/co ratio (ref 0.0–0.9)
HIV Screen 4th Generation wRfx: NONREACTIVE
Hematocrit: 32.8 % — ABNORMAL LOW (ref 34.0–46.6)
Hemoglobin: 10.1 g/dL — ABNORMAL LOW (ref 11.1–15.9)
Hepatitis B Surface Ag: NEGATIVE
Immature Grans (Abs): 0 10*3/uL (ref 0.0–0.1)
Immature Granulocytes: 0 %
Lymphocytes Absolute: 2 10*3/uL (ref 0.7–3.1)
Lymphs: 25 %
MCH: 26.5 pg — ABNORMAL LOW (ref 26.6–33.0)
MCHC: 30.8 g/dL — ABNORMAL LOW (ref 31.5–35.7)
MCV: 86 fL (ref 79–97)
Monocytes Absolute: 0.5 10*3/uL (ref 0.1–0.9)
Monocytes: 6 %
Neutrophils Absolute: 5.4 10*3/uL (ref 1.4–7.0)
Neutrophils: 68 %
Platelets: 243 10*3/uL (ref 150–450)
RBC: 3.81 x10E6/uL (ref 3.77–5.28)
RDW: 12 % (ref 11.7–15.4)
RPR Ser Ql: NONREACTIVE
Rh Factor: POSITIVE
Rubella Antibodies, IGG: 5.37 index (ref >0.99)
WBC: 8 10*3/uL (ref 3.4–10.8)

## 2019-08-24 LAB — HEMOGLOBIN A1C
Est. average glucose Bld gHb Est-mCnc: 91 mg/dL
Hgb A1c MFr Bld: 4.8 % (ref 4.8–5.6)

## 2019-08-24 LAB — CYTOLOGY - PAP
Chlamydia: NEGATIVE
Comment: NEGATIVE
Comment: NORMAL
Diagnosis: HIGH — AB
Neisseria Gonorrhea: NEGATIVE

## 2019-08-24 LAB — HCV INTERPRETATION

## 2019-08-24 LAB — TSH: TSH: 1.99 u[IU]/mL (ref 0.450–4.500)

## 2019-08-26 ENCOUNTER — Ambulatory Visit: Payer: 59

## 2019-08-30 ENCOUNTER — Other Ambulatory Visit: Payer: Self-pay | Admitting: *Deleted

## 2019-08-30 ENCOUNTER — Other Ambulatory Visit: Payer: Self-pay

## 2019-08-30 ENCOUNTER — Ambulatory Visit: Payer: 59 | Attending: Obstetrics and Gynecology

## 2019-08-30 ENCOUNTER — Ambulatory Visit: Payer: 59 | Admitting: *Deleted

## 2019-08-30 DIAGNOSIS — O99212 Obesity complicating pregnancy, second trimester: Secondary | ICD-10-CM

## 2019-08-30 DIAGNOSIS — O361920 Maternal care for other isoimmunization, second trimester, not applicable or unspecified: Secondary | ICD-10-CM

## 2019-08-30 DIAGNOSIS — O99210 Obesity complicating pregnancy, unspecified trimester: Secondary | ICD-10-CM | POA: Insufficient documentation

## 2019-08-30 DIAGNOSIS — O09292 Supervision of pregnancy with other poor reproductive or obstetric history, second trimester: Secondary | ICD-10-CM | POA: Diagnosis not present

## 2019-08-30 DIAGNOSIS — Z3A25 25 weeks gestation of pregnancy: Secondary | ICD-10-CM

## 2019-08-30 DIAGNOSIS — O0990 Supervision of high risk pregnancy, unspecified, unspecified trimester: Secondary | ICD-10-CM

## 2019-08-30 DIAGNOSIS — Z362 Encounter for other antenatal screening follow-up: Secondary | ICD-10-CM

## 2019-08-30 DIAGNOSIS — E669 Obesity, unspecified: Secondary | ICD-10-CM

## 2019-08-30 DIAGNOSIS — O99213 Obesity complicating pregnancy, third trimester: Secondary | ICD-10-CM

## 2019-09-17 ENCOUNTER — Other Ambulatory Visit: Payer: Self-pay

## 2019-09-17 ENCOUNTER — Other Ambulatory Visit: Payer: 59

## 2019-09-17 ENCOUNTER — Ambulatory Visit (INDEPENDENT_AMBULATORY_CARE_PROVIDER_SITE_OTHER): Payer: 59 | Admitting: Obstetrics and Gynecology

## 2019-09-17 ENCOUNTER — Encounter: Payer: Self-pay | Admitting: Obstetrics and Gynecology

## 2019-09-17 ENCOUNTER — Other Ambulatory Visit: Payer: Self-pay | Admitting: General Practice

## 2019-09-17 VITALS — BP 125/90 | HR 99 | Wt 327.4 lb

## 2019-09-17 DIAGNOSIS — O0930 Supervision of pregnancy with insufficient antenatal care, unspecified trimester: Secondary | ICD-10-CM

## 2019-09-17 DIAGNOSIS — Z23 Encounter for immunization: Secondary | ICD-10-CM | POA: Diagnosis not present

## 2019-09-17 DIAGNOSIS — Z6841 Body Mass Index (BMI) 40.0 and over, adult: Secondary | ICD-10-CM

## 2019-09-17 DIAGNOSIS — Z8632 Personal history of gestational diabetes: Secondary | ICD-10-CM

## 2019-09-17 DIAGNOSIS — O361910 Maternal care for other isoimmunization, first trimester, not applicable or unspecified: Secondary | ICD-10-CM

## 2019-09-17 DIAGNOSIS — O0990 Supervision of high risk pregnancy, unspecified, unspecified trimester: Secondary | ICD-10-CM

## 2019-09-17 DIAGNOSIS — O99210 Obesity complicating pregnancy, unspecified trimester: Secondary | ICD-10-CM

## 2019-09-17 DIAGNOSIS — O10919 Unspecified pre-existing hypertension complicating pregnancy, unspecified trimester: Secondary | ICD-10-CM

## 2019-09-17 DIAGNOSIS — Z3A27 27 weeks gestation of pregnancy: Secondary | ICD-10-CM

## 2019-09-17 MED ORDER — ASPIRIN EC 81 MG PO TBEC
81.0000 mg | DELAYED_RELEASE_TABLET | Freq: Every day | ORAL | 3 refills | Status: DC
Start: 2019-09-17 — End: 2019-12-10

## 2019-09-17 NOTE — Progress Notes (Signed)
BTL signed 09/17/2019

## 2019-09-17 NOTE — Progress Notes (Signed)
Prenatal Visit Note Date: 09/17/2019 [facility]  Subjective:  Kelly Willis is a 24 y.o. G3P2002 at [redacted]w[redacted]d being seen today for ongoing prenatal care.  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  Patient reports no complaints.   Contractions: Not present. Vag. Bleeding: None.  Movement: Present. Denies leaking of fluid.   The following portions of the patient's history were reviewed and updated as appropriate: allergies, current medications, past family history, past medical history, past social history, past surgical history and problem list. Problem list updated.  Objective:   Vitals:   09/17/19 0930  BP: 125/90  Pulse: 99  Weight: (!) 327 lb 6.4 oz (148.5 kg)    Fetal Status: Fetal Heart Rate (bpm): 135   Movement: Present     General:  Alert, oriented and cooperative. Patient is in no acute distress.  Skin: Skin is warm and dry. No rash noted.   Cardiovascular: Normal heart rate noted  Respiratory: Normal respiratory effort, no problems with respiration noted  Abdomen: Soft, gravid, appropriate for gestational age. Pain/Pressure: Present     Pelvic:  Cervical exam deferred        Extremities: Normal range of motion.  Edema: None  Mental Status: Normal mood and affect. Normal behavior. Normal judgment and thought content.   Urinalysis:      Assessment and Plan:  Pregnancy: G3P2002 at [redacted]w[redacted]d  1. 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 Chronic hypertension affecting pregnancy Looking back at past  pressures I told her I'd give her this dx. D/w her that hopefully will just mean serial growth u/s and IOL around [facility]. Pt amenable to starting low dose asa. Baseline labs ordered. Has rpt u/s ordered already for later this month.  - Comprehensive metabolic panel - Protein / creatinine ratio, urine  3. Supervision of high risk pregnancy, antepartum 28wk labs today. btl papers signed today. D/w her re: btl r/b and that due to her weight, PPBTL will be at surgeon's discretion - Comprehensive metabolic panel - Protein / creatinine ratio, urine  4. Obesity in pregnancy  5. History of gestational diabetes mellitus  6. Late prenatal care  7. BMI 50.0-59.9, adult (HCC)  Preterm labor symptoms and general obstetric precautions including but not limited to vaginal bleeding, contractions, leaking of fluid and fetal movement were reviewed in detail with the patient. Please refer to After Visit Summary for other counseling recommendations.  Return in about 10 days (around 09/27/2019) for high risk, in person.   Kincaid Bing, MD

## 2019-09-18 LAB — CBC
Hematocrit: 29.9 % — ABNORMAL LOW (ref 34.0–46.6)
Hemoglobin: 9.5 g/dL — ABNORMAL LOW (ref 11.1–15.9)
MCH: 26.5 pg — ABNORMAL LOW (ref 26.6–33.0)
MCHC: 31.8 g/dL (ref 31.5–35.7)
MCV: 84 fL (ref 79–97)
Platelets: 263 10*3/uL (ref 150–450)
RBC: 3.58 x10E6/uL — ABNORMAL LOW (ref 3.77–5.28)
RDW: 12.1 % (ref 11.7–15.4)
WBC: 7.8 10*3/uL (ref 3.4–10.8)

## 2019-09-18 LAB — GLUCOSE TOLERANCE, 2 HOURS W/ 1HR
Glucose, 1 hour: 119 mg/dL (ref 65–179)
Glucose, 2 hour: 97 mg/dL (ref 65–152)
Glucose, Fasting: 90 mg/dL (ref 65–91)

## 2019-09-18 LAB — PROTEIN / CREATININE RATIO, URINE
Creatinine, Urine: 170.3 mg/dL
Protein, Ur: 22.2 mg/dL
Protein/Creat Ratio: 130 mg/g creat (ref 0–200)

## 2019-09-18 LAB — RPR: RPR Ser Ql: NONREACTIVE

## 2019-09-18 LAB — HIV ANTIBODY (ROUTINE TESTING W REFLEX): HIV Screen 4th Generation wRfx: NONREACTIVE

## 2019-09-20 ENCOUNTER — Telehealth (INDEPENDENT_AMBULATORY_CARE_PROVIDER_SITE_OTHER): Payer: 59 | Admitting: Lactation Services

## 2019-09-20 ENCOUNTER — Telehealth: Payer: Self-pay

## 2019-09-20 ENCOUNTER — Encounter: Payer: Self-pay | Admitting: Obstetrics and Gynecology

## 2019-09-20 ENCOUNTER — Encounter: Payer: Self-pay | Admitting: *Deleted

## 2019-09-20 DIAGNOSIS — O99019 Anemia complicating pregnancy, unspecified trimester: Secondary | ICD-10-CM | POA: Insufficient documentation

## 2019-09-20 DIAGNOSIS — O99013 Anemia complicating pregnancy, third trimester: Secondary | ICD-10-CM

## 2019-09-20 NOTE — Telephone Encounter (Signed)
Notified pt of colpo results and the need for colpo that will be done at her visit on 09/27/19.  Explained to the pt what is a colp.  Pt verbalized understanding with no further questions.   [place] Naegeli, RN  09/20/19

## 2019-09-20 NOTE — Telephone Encounter (Signed)
Called Short Stay and scheduled first Feraheme on 8/12 @ 10:00.   Called patient, no answer. LM for her to call the office ASAP for results and appointment.   Will send My Chart message also.

## 2019-09-20 NOTE — Telephone Encounter (Signed)
-----   Message from Ochiltree Bing, MD sent at 09/20/2019  8:14 AM EDT ----- Can you set her up for feraheme? thanks

## 2019-09-20 NOTE — Telephone Encounter (Signed)
Called patient and she reports she did get the message about the Feraheme infusion. Questions answered. Patient to call with any questions or concerns as needed.

## 2019-09-21 LAB — COMPREHENSIVE METABOLIC PANEL
ALT: 17 IU/L (ref 0–32)
AST: 18 IU/L (ref 0–40)
Albumin/Globulin Ratio: 1.2 (ref 1.2–2.2)
Albumin: 3.6 g/dL — ABNORMAL LOW (ref 3.9–5.0)
Alkaline Phosphatase: 121 IU/L (ref 48–121)
BUN/Creatinine Ratio: 11 (ref 9–23)
BUN: 7 mg/dL (ref 6–20)
Bilirubin Total: 0.3 mg/dL (ref 0.0–1.2)
CO2: 21 mmol/L (ref 20–29)
Calcium: 8.9 mg/dL (ref 8.7–10.2)
Chloride: 103 mmol/L (ref 96–106)
Creatinine, Ser: 0.66 mg/dL (ref 0.57–1.00)
GFR calc Af Amer: 142 mL/min/{1.73_m2} (ref >59)
GFR calc non Af Amer: 123 mL/min/{1.73_m2} (ref >59)
Globulin, Total: 3 g/dL (ref 1.5–4.5)
Glucose: 115 mg/dL — ABNORMAL HIGH (ref 65–99)
Potassium: 3.8 mmol/L (ref 3.5–5.2)
Sodium: 135 mmol/L (ref 134–144)
Total Protein: 6.6 g/dL (ref 6.0–8.5)

## 2019-09-21 LAB — ANTIBODY SCREEN

## 2019-09-21 LAB — AB SCR+ANTIBODY ID: Antibody Screen: POSITIVE — AB

## 2019-09-22 NOTE — Discharge Instructions (Signed)

## 2019-09-23 ENCOUNTER — Other Ambulatory Visit: Payer: Self-pay

## 2019-09-23 ENCOUNTER — Encounter (HOSPITAL_COMMUNITY)
Admission: RE | Admit: 2019-09-23 | Discharge: 2019-09-23 | Disposition: A | Discharge status: Home / Self Care | Payer: 59 | Source: Ambulatory Visit | Attending: Obstetrics and Gynecology | Admitting: Obstetrics and Gynecology

## 2019-09-23 DIAGNOSIS — O99012 Anemia complicating pregnancy, second trimester: Secondary | ICD-10-CM | POA: Insufficient documentation

## 2019-09-23 DIAGNOSIS — Z3A27 27 weeks gestation of pregnancy: Secondary | ICD-10-CM | POA: Diagnosis not present

## 2019-09-23 DIAGNOSIS — D649 Anemia, unspecified: Secondary | ICD-10-CM | POA: Insufficient documentation

## 2019-09-23 MED ORDER — SODIUM CHLORIDE 0.9 % IV SOLN
510.0000 mg | INTRAVENOUS | Status: DC
  Administered 2019-09-23: 510 mg via INTRAVENOUS
  Filled 2019-09-23: qty 17

## 2019-09-27 ENCOUNTER — Encounter: Payer: Self-pay | Admitting: *Deleted

## 2019-09-27 ENCOUNTER — Other Ambulatory Visit: Payer: Self-pay | Admitting: *Deleted

## 2019-09-27 ENCOUNTER — Ambulatory Visit: Payer: 59 | Attending: Obstetrics and Gynecology

## 2019-09-27 ENCOUNTER — Other Ambulatory Visit: Payer: Self-pay

## 2019-09-27 ENCOUNTER — Ambulatory Visit: Payer: 59 | Admitting: *Deleted

## 2019-09-27 ENCOUNTER — Encounter: Payer: 59 | Admitting: Obstetrics and Gynecology

## 2019-09-27 DIAGNOSIS — O361910 Maternal care for other isoimmunization, first trimester, not applicable or unspecified: Secondary | ICD-10-CM

## 2019-09-27 DIAGNOSIS — O0990 Supervision of high risk pregnancy, unspecified, unspecified trimester: Secondary | ICD-10-CM | POA: Insufficient documentation

## 2019-09-27 DIAGNOSIS — O09293 Supervision of pregnancy with other poor reproductive or obstetric history, third trimester: Secondary | ICD-10-CM

## 2019-09-27 DIAGNOSIS — O99210 Obesity complicating pregnancy, unspecified trimester: Secondary | ICD-10-CM | POA: Insufficient documentation

## 2019-09-27 DIAGNOSIS — Z3A29 29 weeks gestation of pregnancy: Secondary | ICD-10-CM

## 2019-09-27 DIAGNOSIS — O99213 Obesity complicating pregnancy, third trimester: Secondary | ICD-10-CM | POA: Insufficient documentation

## 2019-09-27 DIAGNOSIS — E669 Obesity, unspecified: Secondary | ICD-10-CM | POA: Diagnosis not present

## 2019-09-27 DIAGNOSIS — Z362 Encounter for other antenatal screening follow-up: Secondary | ICD-10-CM

## 2019-09-27 DIAGNOSIS — Z6841 Body Mass Index (BMI) 40.0 and over, adult: Secondary | ICD-10-CM

## 2019-09-30 ENCOUNTER — Encounter: Payer: Self-pay | Admitting: *Deleted

## 2019-09-30 ENCOUNTER — Telehealth: Payer: Self-pay | Admitting: *Deleted

## 2019-09-30 ENCOUNTER — Ambulatory Visit (HOSPITAL_COMMUNITY)
Admission: RE | Admit: 2019-09-30 | Discharge: 2019-09-30 | Disposition: A | Discharge status: Home / Self Care | Payer: 59 | Source: Ambulatory Visit | Attending: Obstetrics and Gynecology | Admitting: Obstetrics and Gynecology

## 2019-09-30 DIAGNOSIS — O99013 Anemia complicating pregnancy, third trimester: Secondary | ICD-10-CM | POA: Diagnosis present

## 2019-09-30 DIAGNOSIS — D649 Anemia, unspecified: Secondary | ICD-10-CM | POA: Insufficient documentation

## 2019-09-30 DIAGNOSIS — Z3A00 Weeks of gestation of pregnancy not specified: Secondary | ICD-10-CM | POA: Diagnosis not present

## 2019-09-30 MED ORDER — SODIUM CHLORIDE 0.9 % IV SOLN
510.0000 mg | INTRAVENOUS | Status: AC
  Administered 2019-09-30: 510 mg via INTRAVENOUS
  Filled 2019-09-30: qty 17

## 2019-09-30 NOTE — Telephone Encounter (Signed)
Received [employer] Car[employer] screening report indicating Silent carrier for Alpha- Thalassemia.   I called Liesl and informed her of results and that she can call [facility] to schedule free genetic counseling and partner testing. I gave her the phone number. She voices understanding Priest Lockridge,RN

## 2019-10-06 DIAGNOSIS — D563 Thalassemia minor: Secondary | ICD-10-CM | POA: Insufficient documentation

## 2019-10-12 ENCOUNTER — Encounter: Payer: 59 | Admitting: Student

## 2019-10-25 ENCOUNTER — Other Ambulatory Visit: Payer: Self-pay

## 2019-10-25 ENCOUNTER — Other Ambulatory Visit: Payer: Self-pay | Admitting: *Deleted

## 2019-10-25 ENCOUNTER — Encounter: Payer: Self-pay | Admitting: *Deleted

## 2019-10-25 ENCOUNTER — Ambulatory Visit: Payer: 59 | Attending: Obstetrics and Gynecology

## 2019-10-25 ENCOUNTER — Ambulatory Visit: Payer: 59 | Admitting: *Deleted

## 2019-10-25 DIAGNOSIS — O0990 Supervision of high risk pregnancy, unspecified, unspecified trimester: Secondary | ICD-10-CM | POA: Insufficient documentation

## 2019-10-25 DIAGNOSIS — O99210 Obesity complicating pregnancy, unspecified trimester: Secondary | ICD-10-CM | POA: Insufficient documentation

## 2019-10-25 DIAGNOSIS — Z6841 Body Mass Index (BMI) 40.0 and over, adult: Secondary | ICD-10-CM | POA: Diagnosis present

## 2019-10-25 DIAGNOSIS — O361910 Maternal care for other isoimmunization, first trimester, not applicable or unspecified: Secondary | ICD-10-CM

## 2019-10-25 DIAGNOSIS — O10913 Unspecified pre-existing hypertension complicating pregnancy, third trimester: Secondary | ICD-10-CM

## 2019-10-25 DIAGNOSIS — I10 Essential (primary) hypertension: Secondary | ICD-10-CM

## 2019-10-25 DIAGNOSIS — O99213 Obesity complicating pregnancy, third trimester: Secondary | ICD-10-CM

## 2019-10-25 DIAGNOSIS — Z362 Encounter for other antenatal screening follow-up: Secondary | ICD-10-CM | POA: Diagnosis not present

## 2019-10-25 DIAGNOSIS — Z3A33 33 weeks gestation of pregnancy: Secondary | ICD-10-CM

## 2019-10-25 DIAGNOSIS — O09293 Supervision of pregnancy with other poor reproductive or obstetric history, third trimester: Secondary | ICD-10-CM | POA: Diagnosis not present

## 2019-10-25 DIAGNOSIS — Z8632 Personal history of gestational diabetes: Secondary | ICD-10-CM

## 2019-10-25 DIAGNOSIS — O10919 Unspecified pre-existing hypertension complicating pregnancy, unspecified trimester: Secondary | ICD-10-CM

## 2019-11-23 ENCOUNTER — Other Ambulatory Visit: Payer: Self-pay

## 2019-11-23 ENCOUNTER — Encounter: Payer: Self-pay | Admitting: *Deleted

## 2019-11-23 ENCOUNTER — Ambulatory Visit: Payer: 59 | Admitting: *Deleted

## 2019-11-23 ENCOUNTER — Ambulatory Visit: Payer: 59 | Attending: Obstetrics

## 2019-11-23 DIAGNOSIS — O99213 Obesity complicating pregnancy, third trimester: Secondary | ICD-10-CM | POA: Diagnosis not present

## 2019-11-23 DIAGNOSIS — O10913 Unspecified pre-existing hypertension complicating pregnancy, third trimester: Secondary | ICD-10-CM

## 2019-11-23 DIAGNOSIS — Z3A37 37 weeks gestation of pregnancy: Secondary | ICD-10-CM

## 2019-11-23 DIAGNOSIS — O99210 Obesity complicating pregnancy, unspecified trimester: Secondary | ICD-10-CM | POA: Insufficient documentation

## 2019-11-23 DIAGNOSIS — O09293 Supervision of pregnancy with other poor reproductive or obstetric history, third trimester: Secondary | ICD-10-CM | POA: Diagnosis not present

## 2019-11-23 DIAGNOSIS — Z362 Encounter for other antenatal screening follow-up: Secondary | ICD-10-CM

## 2019-11-23 DIAGNOSIS — O361910 Maternal care for other isoimmunization, first trimester, not applicable or unspecified: Secondary | ICD-10-CM

## 2019-11-23 DIAGNOSIS — O0990 Supervision of high risk pregnancy, unspecified, unspecified trimester: Secondary | ICD-10-CM

## 2019-11-23 DIAGNOSIS — I10 Essential (primary) hypertension: Secondary | ICD-10-CM

## 2019-11-23 DIAGNOSIS — O10919 Unspecified pre-existing hypertension complicating pregnancy, unspecified trimester: Secondary | ICD-10-CM | POA: Insufficient documentation

## 2019-11-23 DIAGNOSIS — E669 Obesity, unspecified: Secondary | ICD-10-CM

## 2019-12-01 ENCOUNTER — Other Ambulatory Visit: Payer: Self-pay

## 2019-12-01 ENCOUNTER — Other Ambulatory Visit (HOSPITAL_COMMUNITY)
Admission: RE | Admit: 2019-12-01 | Discharge: 2019-12-01 | Disposition: A | Discharge status: Home / Self Care | Payer: 59 | Source: Ambulatory Visit | Attending: Obstetrics & Gynecology | Admitting: Obstetrics & Gynecology

## 2019-12-01 ENCOUNTER — Ambulatory Visit (INDEPENDENT_AMBULATORY_CARE_PROVIDER_SITE_OTHER): Payer: 59 | Admitting: Obstetrics & Gynecology

## 2019-12-01 VITALS — BP 126/88 | HR 103 | Wt 318.9 lb

## 2019-12-01 DIAGNOSIS — O10919 Unspecified pre-existing hypertension complicating pregnancy, unspecified trimester: Secondary | ICD-10-CM

## 2019-12-01 DIAGNOSIS — Z6841 Body Mass Index (BMI) 40.0 and over, adult: Secondary | ICD-10-CM

## 2019-12-01 DIAGNOSIS — O0990 Supervision of high risk pregnancy, unspecified, unspecified trimester: Secondary | ICD-10-CM | POA: Insufficient documentation

## 2019-12-01 DIAGNOSIS — Z8632 Personal history of gestational diabetes: Secondary | ICD-10-CM

## 2019-12-01 NOTE — Patient Instructions (Signed)

## 2019-12-01 NOTE — Progress Notes (Signed)
   PRENATAL VISIT NOTE  Subjective:  Kelly Willis is a 24 y.o. G3P2002 at [redacted]w[redacted]d being seen today for ongoing prenatal care.  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  Patient reports occasional contractions.  Contractions: Irregular. Vag. Bleeding: None.  Movement: Present. Denies leaking of fluid.   The following portions of the patient's history were reviewed and updated as appropriate: allergies, current medications, past family history, past medical history, past social history, past surgical history and problem list.   Objective:   Vitals:   12/01/19 1409  BP: 126/88  Pulse: (!) 103  Weight: (!) 318 lb 14.4 oz (144.7 kg)    Fetal Status: Fetal Heart Rate (bpm): 143 Fundal Height: 38 cm Movement: Present  Presentation: Vertex  General:  Alert, oriented and cooperative. Patient is in no acute distress.  Skin: Skin is warm and dry. No rash noted.   Cardiovascular: Normal heart rate noted  Respiratory: Normal respiratory effort, no problems with respiration noted  Abdomen: Soft, gravid, appropriate for gestational age.  Pain/Pressure: Present     Pelvic: Cervical exam performed in the presence of a chaperone Dilation: 2 Effacement (%): 40 Station: -3  Extremities: Normal range of motion.  Edema: Trace  Mental Status: Normal mood and affect. Normal behavior. Normal judgment and thought content.   Assessment and Plan:  Pregnancy: G3P2002 at [redacted]w[redacted]d 1. Supervision of high risk pregnancy, antepartum routine - GC/Chlamydia probe amp ([facility])not at [facility] - Culture, beta strep (group b only)  2. BMI 50.0-59.9, adult (HCC)   3. History of gestational diabetes mellitus   4. Chronic hypertension affecting pregnancy Nl BP no meds, nl growth  Term labor symptoms and general obstetric precautions including but not limited to vaginal bleeding, contractions, leaking of fluid and fetal movement were reviewed in detail with the patient. Please refer to After Visit Summary for other counseling recommendations.   Return in about 1 week (around 12/08/2019).  No future appointments.  Scheryl Darter, MD

## 2019-12-02 LAB — GC/CHLAMYDIA PROBE AMP (~~LOC~~) NOT AT ARMC
Chlamydia: NEGATIVE
Comment: NEGATIVE
Comment: NORMAL
Neisseria Gonorrhea: NEGATIVE

## 2019-12-05 LAB — CULTURE, BETA STREP (GROUP B ONLY): Strep Gp B Culture: NEGATIVE

## 2019-12-06 ENCOUNTER — Encounter (HOSPITAL_COMMUNITY): Payer: Self-pay | Admitting: Obstetrics & Gynecology

## 2019-12-06 ENCOUNTER — Other Ambulatory Visit: Payer: Self-pay

## 2019-12-06 ENCOUNTER — Inpatient Hospital Stay (HOSPITAL_COMMUNITY)
Admission: AD | Admit: 2019-12-06 | Discharge: 2019-12-06 | Disposition: A | Discharge status: Home / Self Care | Payer: 59 | Source: Home / Self Care | Attending: Family Medicine | Admitting: Family Medicine

## 2019-12-06 DIAGNOSIS — Z3A38 38 weeks gestation of pregnancy: Secondary | ICD-10-CM | POA: Insufficient documentation

## 2019-12-06 DIAGNOSIS — O479 False labor, unspecified: Secondary | ICD-10-CM

## 2019-12-06 DIAGNOSIS — O471 False labor at or after 37 completed weeks of gestation: Secondary | ICD-10-CM | POA: Insufficient documentation

## 2019-12-06 NOTE — MAU Note (Signed)
Patient reports for CTX every 5-10 mins.  Denies LOF/VB. Endorses + FM.  Denies having any complications w/ her pregnancy.  2 cm on Friday.

## 2019-12-06 NOTE — MAU Note (Signed)
I have communicated with Donette Larry, CNM and reviewed vital signs:  Vitals:   12/06/19 1322 12/06/19 1436  BP: 108/71 127/79  Pulse: 100 (!) 103  Resp: 19 18  Temp: 98 F (36.7 C)     Vaginal exam:  Dilation: 2.5 Effacement (%): 50 Cervical Position: Anterior Station: -3 Presentation: Vertex Exam by:: Latricia Heft, RN,   Also reviewed contraction pattern and that non-stress test is reactive.  It has been documented that patient is contracting every 5-1 minutes with no cervical change over 1 hour not indicating active labor.  Patient denies any other complaints.  Based on this report provider has given order for discharge.  A discharge order and diagnosis entered by a provider.   Labor discharge instructions reviewed with patient.

## 2019-12-08 ENCOUNTER — Inpatient Hospital Stay (HOSPITAL_COMMUNITY): Payer: 59 | Admitting: Anesthesiology

## 2019-12-08 ENCOUNTER — Inpatient Hospital Stay (HOSPITAL_COMMUNITY)
Admission: AD | Admit: 2019-12-08 | Discharge: 2019-12-10 | DRG: 798 | Disposition: A | Discharge status: Home / Self Care | Payer: 59 | Attending: Obstetrics & Gynecology | Admitting: Obstetrics & Gynecology

## 2019-12-08 ENCOUNTER — Encounter: Payer: 59 | Admitting: Obstetrics and Gynecology

## 2019-12-08 ENCOUNTER — Other Ambulatory Visit: Payer: Self-pay

## 2019-12-08 ENCOUNTER — Encounter (HOSPITAL_COMMUNITY): Payer: Self-pay | Admitting: Obstetrics & Gynecology

## 2019-12-08 DIAGNOSIS — O99214 Obesity complicating childbirth: Secondary | ICD-10-CM | POA: Diagnosis present

## 2019-12-08 DIAGNOSIS — D563 Thalassemia minor: Secondary | ICD-10-CM | POA: Diagnosis present

## 2019-12-08 DIAGNOSIS — Z302 Encounter for sterilization: Secondary | ICD-10-CM

## 2019-12-08 DIAGNOSIS — Z8632 Personal history of gestational diabetes: Secondary | ICD-10-CM

## 2019-12-08 DIAGNOSIS — O0930 Supervision of pregnancy with insufficient antenatal care, unspecified trimester: Secondary | ICD-10-CM

## 2019-12-08 DIAGNOSIS — Z87891 Personal history of nicotine dependence: Secondary | ICD-10-CM

## 2019-12-08 DIAGNOSIS — O1002 Pre-existing essential hypertension complicating childbirth: Principal | ICD-10-CM | POA: Diagnosis present

## 2019-12-08 DIAGNOSIS — Z9851 Tubal ligation status: Secondary | ICD-10-CM

## 2019-12-08 DIAGNOSIS — O4202 Full-term premature rupture of membranes, onset of labor within 24 hours of rupture: Secondary | ICD-10-CM | POA: Diagnosis not present

## 2019-12-08 DIAGNOSIS — O10919 Unspecified pre-existing hypertension complicating pregnancy, unspecified trimester: Secondary | ICD-10-CM | POA: Diagnosis present

## 2019-12-08 DIAGNOSIS — O9902 Anemia complicating childbirth: Secondary | ICD-10-CM | POA: Diagnosis present

## 2019-12-08 DIAGNOSIS — O99019 Anemia complicating pregnancy, unspecified trimester: Secondary | ICD-10-CM | POA: Diagnosis present

## 2019-12-08 DIAGNOSIS — O99210 Obesity complicating pregnancy, unspecified trimester: Secondary | ICD-10-CM | POA: Diagnosis present

## 2019-12-08 DIAGNOSIS — Z3A38 38 weeks gestation of pregnancy: Secondary | ICD-10-CM | POA: Diagnosis not present

## 2019-12-08 DIAGNOSIS — Z20822 Contact with and (suspected) exposure to covid-19: Secondary | ICD-10-CM | POA: Diagnosis present

## 2019-12-08 DIAGNOSIS — O26893 Other specified pregnancy related conditions, third trimester: Secondary | ICD-10-CM | POA: Diagnosis present

## 2019-12-08 DIAGNOSIS — D509 Iron deficiency anemia, unspecified: Secondary | ICD-10-CM | POA: Diagnosis present

## 2019-12-08 DIAGNOSIS — O0990 Supervision of high risk pregnancy, unspecified, unspecified trimester: Secondary | ICD-10-CM

## 2019-12-08 LAB — RESPIRATORY PANEL BY RT PCR (FLU A&B, COVID)
Influenza A by PCR: NEGATIVE
Influenza B by PCR: NEGATIVE
SARS Coronavirus 2 by RT PCR: NEGATIVE

## 2019-12-08 LAB — CBC
HCT: 37.2 % (ref 36.0–46.0)
Hemoglobin: 11.3 g/dL — ABNORMAL LOW (ref 12.0–15.0)
MCH: 26.8 pg (ref 26.0–34.0)
MCHC: 30.4 g/dL (ref 30.0–36.0)
MCV: 88.2 fL (ref 80.0–100.0)
Platelets: 257 10*3/uL (ref 150–400)
RBC: 4.22 MIL/uL (ref 3.87–5.11)
RDW: 13.2 % (ref 11.5–15.5)
WBC: 8.8 10*3/uL (ref 4.0–10.5)
nRBC: 0 % (ref 0.0–0.2)

## 2019-12-08 LAB — POCT FERN TEST: POCT Fern Test: POSITIVE

## 2019-12-08 MED ORDER — ACETAMINOPHEN 325 MG PO TABS: 650.0000 mg | ORAL_TABLET | ORAL | Status: DC | PRN

## 2019-12-08 MED ORDER — ONDANSETRON HCL 4 MG/2ML IJ SOLN: 4.0000 mg | INTRAMUSCULAR | Status: DC | PRN

## 2019-12-08 MED ORDER — OXYTOCIN BOLUS FROM INFUSION
333.0000 mL | Freq: Once | INTRAVENOUS | Status: DC
  Administered 2019-12-08: 333 mL via INTRAVENOUS

## 2019-12-08 MED ORDER — PHENYLEPHRINE 40 MCG/ML (10ML) SYRINGE FOR IV PUSH (FOR BLOOD PRESSURE SUPPORT): 80.0000 ug | PREFILLED_SYRINGE | INTRAVENOUS | Status: DC | PRN

## 2019-12-08 MED ORDER — LACTATED RINGERS IV SOLN
500.0000 mL | Freq: Once | INTRAVENOUS | Status: AC
  Administered 2019-12-08: 500 mL via INTRAVENOUS

## 2019-12-08 MED ORDER — EPHEDRINE 5 MG/ML INJ: 10.0000 mg | INTRAVENOUS | Status: DC | PRN

## 2019-12-08 MED ORDER — TETANUS-DIPHTH-ACELL PERTUSSIS 5-2.5-18.5 LF-MCG/0.5 IM SUSY: 0.5000 mL | PREFILLED_SYRINGE | Freq: Once | INTRAMUSCULAR | Status: DC

## 2019-12-08 MED ORDER — OXYCODONE-ACETAMINOPHEN 5-325 MG PO TABS: 1.0000 | ORAL_TABLET | ORAL | Status: DC | PRN

## 2019-12-08 MED ORDER — SENNOSIDES-DOCUSATE SODIUM 8.6-50 MG PO TABS
2.0000 | ORAL_TABLET | ORAL | Status: DC
  Administered 2019-12-08: 2 via ORAL
  Filled 2019-12-08: qty 2

## 2019-12-08 MED ORDER — FENTANYL-BUPIVACAINE-NACL 0.5-0.125-0.9 MG/250ML-% EP SOLN
12.0000 mL/h | EPIDURAL | Status: DC | PRN
  Filled 2019-12-08: qty 250

## 2019-12-08 MED ORDER — COCONUT OIL OIL: 1.0000 "application " | TOPICAL_OIL | Status: DC | PRN

## 2019-12-08 MED ORDER — LIDOCAINE HCL (PF) 1 % IJ SOLN: 30.0000 mL | INTRAMUSCULAR | Status: DC | PRN

## 2019-12-08 MED ORDER — PRENATAL MULTIVITAMIN CH
1.0000 | ORAL_TABLET | Freq: Every day | ORAL | Status: DC
  Administered 2019-12-09: 1 via ORAL
  Filled 2019-12-08: qty 1

## 2019-12-08 MED ORDER — OXYCODONE-ACETAMINOPHEN 5-325 MG PO TABS: 2.0000 | ORAL_TABLET | ORAL | Status: DC | PRN

## 2019-12-08 MED ORDER — SODIUM CHLORIDE 0.9% IV SOLUTION: Freq: Once | INTRAVENOUS | Status: DC

## 2019-12-08 MED ORDER — IBUPROFEN 600 MG PO TABS
600.0000 mg | ORAL_TABLET | Freq: Four times a day (QID) | ORAL | Status: DC
  Administered 2019-12-08 – 2019-12-10 (×6): 600 mg via ORAL
  Filled 2019-12-08 (×6): qty 1

## 2019-12-08 MED ORDER — LACTATED RINGERS IV SOLN: INTRAVENOUS | Status: DC

## 2019-12-08 MED ORDER — DIPHENHYDRAMINE HCL 25 MG PO CAPS: 25.0000 mg | ORAL_CAPSULE | Freq: Four times a day (QID) | ORAL | Status: DC | PRN

## 2019-12-08 MED ORDER — DIPHENHYDRAMINE HCL 50 MG/ML IJ SOLN
12.5000 mg | INTRAMUSCULAR | Status: DC | PRN
  Administered 2019-12-08 (×2): 12.5 mg via INTRAVENOUS
  Filled 2019-12-08: qty 1

## 2019-12-08 MED ORDER — LACTATED RINGERS IV SOLN: 500.0000 mL | INTRAVENOUS | Status: DC | PRN

## 2019-12-08 MED ORDER — DIBUCAINE (PERIANAL) 1 % EX OINT: 1.0000 "application " | TOPICAL_OINTMENT | CUTANEOUS | Status: DC | PRN

## 2019-12-08 MED ORDER — SIMETHICONE 80 MG PO CHEW: 80.0000 mg | CHEWABLE_TABLET | ORAL | Status: DC | PRN

## 2019-12-08 MED ORDER — SODIUM CHLORIDE (PF) 0.9 % IJ SOLN
INTRAMUSCULAR | Status: DC | PRN
  Administered 2019-12-08: 12 mL/h via EPIDURAL

## 2019-12-08 MED ORDER — ACETAMINOPHEN 500 MG PO TABS
1000.0000 mg | ORAL_TABLET | Freq: Three times a day (TID) | ORAL | Status: DC
  Administered 2019-12-08 – 2019-12-10 (×5): 1000 mg via ORAL
  Filled 2019-12-08 (×6): qty 2

## 2019-12-08 MED ORDER — BENZOCAINE-MENTHOL 20-0.5 % EX AERO
1.0000 "application " | INHALATION_SPRAY | CUTANEOUS | Status: DC | PRN
  Administered 2019-12-09: 1 via TOPICAL
  Filled 2019-12-08: qty 56

## 2019-12-08 MED ORDER — OXYTOCIN-SODIUM CHLORIDE 30-0.9 UT/500ML-% IV SOLN
2.5000 [IU]/h | INTRAVENOUS | Status: DC
  Filled 2019-12-08: qty 500

## 2019-12-08 MED ORDER — WITCH HAZEL-GLYCERIN EX PADS: 1.0000 "application " | MEDICATED_PAD | CUTANEOUS | Status: DC | PRN

## 2019-12-08 MED ORDER — ONDANSETRON HCL 4 MG PO TABS: 4.0000 mg | ORAL_TABLET | ORAL | Status: DC | PRN

## 2019-12-08 MED ORDER — SOD CITRATE-CITRIC ACID 500-334 MG/5ML PO SOLN: 30.0000 mL | ORAL | Status: DC | PRN

## 2019-12-08 MED ORDER — LIDOCAINE HCL (PF) 1 % IJ SOLN
INTRAMUSCULAR | Status: DC | PRN
  Administered 2019-12-08 (×2): 5 mL via EPIDURAL

## 2019-12-08 MED ORDER — ONDANSETRON HCL 4 MG/2ML IJ SOLN: 4.0000 mg | Freq: Four times a day (QID) | INTRAMUSCULAR | Status: DC | PRN

## 2019-12-08 NOTE — H&P (Addendum)
OBSTETRIC ADMISSION HISTORY AND PHYSICAL  Kelly Willis is a 24 y.o. female G3P2002 with IUP at 25w5dby LMP presenting for SROM. She reports +FMs, No LOF, no VB, no blurry vision, headaches or peripheral edema, and RUQ pain.  She plans on bottle feeding. She request BTL ( signed 09/17/19) for birth control. [facility] D[facility]: By LMP --->  Estimated Date of Delivery: 12/17/19  Sono:    _0 , CWD, normal anatomy, cephalic presentation, [id number] 46% EFW   Prenatal History/Complications:  Patient Active Problem List   Diagnosis Date Noted   Alpha thalassemia silent carrier 10/06/2019   Anemia in pregnancy 09/20/2019   Chronic hypertension affecting pregnancy 09/17/2019   BMI 50.0-59.9, adult ([place] 09/17/2019   Supervision of high risk pregnancy, antepartum 07/07/2019   Obesity in pregnancy    History of gestational diabetes mellitus 10/20/2017   Lesion of skin of breast 10/20/2017   Limited prenatal care, antepartum 10/20/2017   Weight loss 10/20/2017   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     Past Medical History: Past Medical History:  Diagnosis Date   ADHD (attention deficit hyperactivity disorder)    Gestational diabetes    Obesity    Pilonidal abscess 10/16/2012   Pilonidal cyst 06/2014    Past Surgical History: Past Surgical History:  Procedure Laterality Date   NO PAST SURGERIES     PILONIDAL CYST EXCISION     PILONIDAL CYST EXCISION N/A 07/08/2014   Procedure: CYST EXCISION PILONIDAL ;  Surgeon: TJackolyn Confer MD;  Location: [facility]  Service: General;  Laterality: N/A;    Obstetrical History: OB History     Gravida  3   Para  2   Term  2   Preterm  0   AB  0   Living  2      SAB  0   TAB  0   Ectopic  0   Multiple  0   Live Births  2           Social History Social History   Socioeconomic History   Marital status: Single    Spouse name: Not on  file   Number of children: Not on file   Years of education: Not on file   Highest education level: Not on file  Occupational History   Not on file  Tobacco Use   Smoking status: Former Smoker    Years: 2.00    Types: Cigarettes    Quit date: 04/15/2017    Years since quitting: 2.6   Smokeless tobacco: Never Used   Tobacco comment: 4 cig./day  Vaping Use   Vaping Use: Never used  Substance and Sexual Activity   Alcohol use: Not Currently    Comment: occasionally   Drug use: Yes    Types: Marijuana    Comment: daily 1.5 per day   Sexual activity: Yes    Partners: Male    Birth control/protection: None  Other Topics Concern   Not on file  Social History Narrative   Not on file   Social Determinants of Health   Financial Resource Strain:    Difficulty of Paying Living Expenses: Not on file  Food Insecurity: Food Insecurity Present   [place] Last [place]: Sometimes true   Ran Out of Food in the Last Year: Often true  Transportation Needs: Unmet Transportation Needs   Lack of Transportation (Medical): No  Lack of Transportation (Non-Medical): Yes  Physical Activity:    Days of Exercise per Week: Not on file   Minutes of Exercise per Session: Not on file  Stress:    Feeling of Stress : Not on file  Social Connections:    Frequency of Communication with Friends and Family: Not on file   Frequency of Social Gatherings with Friends and Family: Not on file   Attends Religious Services: Not on file   Active Member of Clubs or Organizations: Not on file   Attends Archivist Meetings: Not on file   Marital Status: Not on file    Family History: Family History  Problem Relation Age of Onset   Hypertension Maternal Grandfather     Allergies: Allergies  Allergen Reactions   Pepto-Bismol [Bismuth Subsalicylate] Nausea And Vomiting    Medications Prior to Admission  Medication Sig Dispense Refill Last Dose   aspirin EC 81 MG  tablet Take 1 tablet (81 mg total) by mouth daily. 30 tablet 3    Blood Pressure Monitoring (BLOOD PRESSURE KIT) DEVI 1 Device by Does not apply route as needed. 1 each 0      Review of Systems   All systems reviewed and negative except as stated in HPI  Blood pressure 138/68, pulse (!) 105, temperature 98.5 F (36.9 C), resp. rate 18, last menstrual period 03/08/2019, SpO2 99 %, unknown if currently breastfeeding. General appearance: alert and no distress Heart: regular rate and rhythm Abdomen: soft, non-tender; bowel sounds normal Extremities: no sign of DVT Presentation: cephalic Fetal monitoringBaseline: 140 bpm Uterine activityFrequency: Every 4 minutes Dilation: 2.5 Effacement (%): 80 Exam by:: kathy wilson rn   Prenatal labs: ABO, Rh: O/Positive/-- (07/09 1007) Antibody: Positive, See Final Results (08/06 0951) Rubella: 5.37 (07/09 1007) RPR: Non Reactive (08/06 0918)  HBsAg: Negative (07/09 1007)  HIV: Non Reactive (08/06 0918)  GBS: Negative/-- (10/20 1415)  2 hr Glucola 97 Genetic screening  Alpha thal carrier, non clinically significant anti-m antibody Anatomy US normal  Prenatal Transfer Tool  Maternal Diabetes: No Genetic Screening: Abnormal:  Results: Other:Alpha thal carrier Maternal Ultrasounds/Referrals: Normal Fetal Ultrasounds or other Referrals:  None Maternal Substance Abuse:  No Significant Maternal Medications:  None Significant Maternal Lab Results: None  Results for orders placed or performed during the hospital encounter of 12/08/19 (from the past 24 hour(s))  POCT fern test   Collection Time: 12/08/19  1:41 PM  Result Value Ref Range   POCT Fern Test Positive = ruptured amniotic membanes     Patient Active Problem List   Diagnosis Date Noted   Alpha thalassemia silent carrier 10/06/2019   Anemia in pregnancy 09/20/2019   Chronic hypertension affecting pregnancy 09/17/2019   BMI 50.0-59.9, adult ([place]) 09/17/2019   Supervision of high  risk pregnancy, antepartum 07/07/2019   Obesity in pregnancy    History of gestational diabetes mellitus 10/20/2017   Lesion of skin of breast 10/20/2017   Limited prenatal care, antepartum 10/20/2017   Weight loss 10/20/2017   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 G3P2002 at 84w5dhere for SROM  #Labor: SROM, intial CVE 2.5/80/-2, continue expectant management #Pain: Epidural pain #FWB: Cat 2 with sparse intermittent variables with reassuring variability and good recovery. #ID:  GBS neg #MOF: Bottle #MOC:BTL (signed) #Alpha Thal silent carrier/Anti-M isoimmunization: no clinical f/u needed #CHTN: BP prenatal wnl w/o meds. 152/74 on admission. Monitor, collect Pre- E labs, and  treat if Bps remain elevated. #Hx of GDM: normal GTT this pregancy  Baldo Ash, MD  12/08/2019, 1:51 PM   Attestation of Supervision of Student:  I confirm that I have verified the information documented in the  resident  students note and that I have also personally reperformed the history, physical exam and all medical decision making activities.  I have verified that all services and findings are accurately documented in this student's note; and I agree with management and plan as outlined in the documentation. I have also made any necessary editorial changes.    Starr Lake, [place] for [employer], [employer] 12/08/2019 6:23 PM

## 2019-12-08 NOTE — MAU Note (Signed)
Pt reports she started leaking about 30 min ago. Clear fluid. Ctx got worse.

## 2019-12-08 NOTE — Anesthesia Postprocedure Evaluation (Signed)
Anesthesia Post Note  Patient: Erlinda Solinger  Procedure(s) Performed: AN AD HOC LABOR EPIDURAL     Patient location during evaluation: Mother Baby Anesthesia Type: Epidural Level of consciousness: awake and alert Pain management: pain level controlled Vital Signs Assessment: post-procedure vital signs reviewed and stable Respiratory status: spontaneous breathing, nonlabored ventilation and respiratory function stable Cardiovascular status: stable Postop Assessment: no headache, no backache and epidural receding Anesthetic complications: no   No complications documented.  Last Vitals:  Vitals:   12/08/19 2015 12/08/19 2120  BP: 136/77 126/72  Pulse: 86 89  Resp: 18 18  Temp: 36.7 C 37.1 C  SpO2: 100% 100%    Last Pain:  Vitals:   12/08/19 2154  TempSrc:   PainSc: 5    Pain Goal:                   Aidyn Kellis

## 2019-12-08 NOTE — Anesthesia Procedure Notes (Signed)
Epidural Patient location during procedure: OB Start time: 12/08/2019 2:41 PM End time: 12/08/2019 2:51 PM  Staffing Anesthesiologist: Leonides Grills, MD Performed: anesthesiologist   Preanesthetic Checklist Completed: patient identified, IV checked, site marked, risks and benefits discussed, monitors and equipment checked, pre-op evaluation and timeout performed  Epidural Patient position: sitting Prep: DuraPrep Patient monitoring: heart rate, cardiac monitor, continuous pulse ox and blood pressure Approach: midline Location: L3-L4 Injection technique: LOR air  Needle:  Needle type: Tuohy  Needle gauge: 17 G Needle length: 9 cm Needle insertion depth: 9 cm Catheter type: closed end flexible Catheter size: 19 Gauge Catheter at skin depth: 16 cm Test dose: negative and Other  Assessment Events: blood not aspirated, injection not painful, no injection resistance and negative IV test  Additional Notes Informed consent obtained prior to proceeding including risk of failure, 1% risk of PDPH, risk of minor discomfort and bruising. Discussed alternatives to epidural analgesia and patient desires to proceed.  Timeout performed pre-procedure verifying patient name, procedure, and platelet count.  Patient tolerated procedure well. Reason for block:procedure for pain

## 2019-12-08 NOTE — Anesthesia Preprocedure Evaluation (Signed)
Anesthesia Evaluation  Patient identified by MRN, date of birth, ID band Patient awake    Reviewed: Allergy & Precautions, H&P , NPO status , Patient's Chart, lab work & pertinent test results  History of Anesthesia Complications Negative for: history of anesthetic complications  Airway Mallampati: II  TM Distance: >3 FB Neck ROM: full    Dental no notable dental hx. (+) Teeth Intact   Pulmonary former smoker,    Pulmonary exam normal breath sounds clear to auscultation       Cardiovascular negative cardio ROS Normal cardiovascular exam Rhythm:regular Rate:Normal     Neuro/Psych PSYCHIATRIC DISORDERS ADHD (attention deficit hyperactivity disorder)negative neurological ROS     GI/Hepatic negative GI ROS, Neg liver ROS,   Endo/Other  diabetesMorbid obesity (SUPER)  Renal/GU negative Renal ROS  negative genitourinary   Musculoskeletal   Abdominal   Peds  Hematology negative hematology ROS (+)   Anesthesia Other Findings   Reproductive/Obstetrics (+) Pregnancy                             Anesthesia Physical Anesthesia Plan  ASA: IV  Anesthesia Plan: Epidural   Post-op Pain Management:    Induction:   PONV Risk Score and Plan:   Airway Management Planned:   Additional Equipment:   Intra-op Plan:   Post-operative Plan:   Informed Consent: I have reviewed the patients History and Physical, chart, labs and discussed the procedure including the risks, benefits and alternatives for the proposed anesthesia with the patient or authorized representative who has indicated his/her understanding and acceptance.       Plan Discussed with:   Anesthesia Plan Comments:         Anesthesia Quick Evaluation

## 2019-12-09 LAB — RPR
RPR Ser Ql: REACTIVE — AB
RPR Titer: 1:1 {titer}

## 2019-12-09 MED ORDER — METOCLOPRAMIDE HCL 10 MG PO TABS
10.0000 mg | ORAL_TABLET | Freq: Once | ORAL | Status: AC
  Administered 2019-12-10: 10 mg via ORAL
  Filled 2019-12-09 (×2): qty 1

## 2019-12-09 MED ORDER — FAMOTIDINE 20 MG PO TABS
40.0000 mg | ORAL_TABLET | Freq: Once | ORAL | Status: AC
  Administered 2019-12-10: 40 mg via ORAL
  Filled 2019-12-09 (×2): qty 2

## 2019-12-09 MED ORDER — LACTATED RINGERS IV SOLN
INTRAVENOUS | Status: DC
  Administered 2019-12-10: 20 mL/h via INTRAVENOUS

## 2019-12-09 MED ORDER — AMLODIPINE BESYLATE 5 MG PO TABS
5.0000 mg | ORAL_TABLET | Freq: Every day | ORAL | Status: DC
  Administered 2019-12-09 – 2019-12-10 (×2): 5 mg via ORAL
  Filled 2019-12-09 (×2): qty 1

## 2019-12-09 NOTE — Progress Notes (Addendum)
24 y.o. yo [id number]  with undesired fertility,status post vaginal delivery, desires permanent sterilization. Risks and benefits of procedure discussed with patient including permanence of method, bleeding, infection, injury to surrounding organs and need for additional procedures. Risk failure of 0.5-1% with increased risk of ectopic gestation if pregnancy occurs was also discussed with patient.   Addendum 11:40 am Due to OR scheduling constraints, patient scheduled to have the procedure on 12/10/19 at 11:15am.

## 2019-12-09 NOTE — Progress Notes (Addendum)
Postpartum Day 1  Subjective Up ad lib, voiding, tolerating PO, and + flatus. Denies dizziness, lightheadedness, tachycardia. Appropriate Lochia. Pain well controlled.  Objective Temp:  [97.7 F (36.5 C)-99.9 F (37.7 C)] 97.7 F (36.5 C) (10/28 0533) Pulse Rate:  [82-128] 83 (10/28 0533) Resp:  [16-18] 18 (10/28 0533) BP: (91-155)/(55-90) 126/81 (10/28 0533) SpO2:  [98 %-100 %] 100 % (10/28 0533) Weight:  [140.6 kg] 140.6 kg (10/27 1411) Intake/Output      10/27 0701 - 10/28 0700 10/28 0701 - 10/29 0700   Urine (mL/kg/hr) 250    Blood 50    Total Output 300    Net -300         Urine Occurrence 2 x      General: alert, cooperative, NAD Uterine Fundus: firm MSK: moving extremities spontaneously. Warm and well perfused. No signs of DVT.   Recent Labs    12/08/19 1353  HGB 11.3*  HCT 37.2   Assessment & Plan Postpartum Day # 1 . Plan for d/c tomorrow  . Pain: scheduled ibuprofen. PRN tylenol & oxycodone . Feed: bottle . Contraception: BTL @ noon today if possible. NPO since 5am.   Elevated blood pressures without GHTN  126-136/72-82 Bps since delivery. Denies headaches, visual changes.  . Started norvasc 5 mg today .  Continue to monitor.    LOS: 1 day   Melene Plan 12/09/2019, 7:13 AM   I saw and evaluated the patient. I agree with the findings and the plan of care as documented in the resident's note and have made any necessary edits.   Casper Harrison, MD [facility] Fellow, [facility], [facility]

## 2019-12-10 ENCOUNTER — Encounter (HOSPITAL_COMMUNITY): Payer: Self-pay | Admitting: Obstetrics & Gynecology

## 2019-12-10 ENCOUNTER — Encounter (HOSPITAL_COMMUNITY)
Admission: AD | Disposition: A | Discharge status: Home / Self Care | Payer: Self-pay | Source: Home / Self Care | Attending: Obstetrics & Gynecology

## 2019-12-10 ENCOUNTER — Other Ambulatory Visit (HOSPITAL_COMMUNITY): Payer: Self-pay | Admitting: Advanced Practice Midwife

## 2019-12-10 ENCOUNTER — Inpatient Hospital Stay (HOSPITAL_COMMUNITY): Payer: 59 | Admitting: Anesthesiology

## 2019-12-10 DIAGNOSIS — Z302 Encounter for sterilization: Secondary | ICD-10-CM

## 2019-12-10 DIAGNOSIS — Z9851 Tubal ligation status: Secondary | ICD-10-CM

## 2019-12-10 HISTORY — PX: TUBAL LIGATION: SHX77

## 2019-12-10 LAB — T.PALLIDUM AB, TOTAL: T Pallidum Abs: NONREACTIVE

## 2019-12-10 SURGERY — LIGATION, FALLOPIAN TUBE, POSTPARTUM
Anesthesia: Epidural

## 2019-12-10 MED ORDER — LACTATED RINGERS IV SOLN: INTRAVENOUS | Status: DC | PRN

## 2019-12-10 MED ORDER — ONDANSETRON HCL 4 MG/2ML IJ SOLN
INTRAMUSCULAR | Status: AC
  Filled 2019-12-10: qty 2

## 2019-12-10 MED ORDER — LIDOCAINE HCL (PF) 2 % IJ SOLN
INTRAMUSCULAR | Status: AC
  Filled 2019-12-10: qty 5

## 2019-12-10 MED ORDER — OXYCODONE HCL 5 MG PO TABS
ORAL_TABLET | ORAL | Status: AC
  Filled 2019-12-10: qty 2

## 2019-12-10 MED ORDER — FENTANYL CITRATE (PF) 100 MCG/2ML IJ SOLN
INTRAMUSCULAR | Status: DC | PRN
  Administered 2019-12-10 (×4): 25 ug via INTRAVENOUS

## 2019-12-10 MED ORDER — SODIUM CHLORIDE 0.9 % IR SOLN
Status: DC | PRN
  Administered 2019-12-10: 1

## 2019-12-10 MED ORDER — EPINEPHRINE PF 1 MG/ML IJ SOLN
INTRAMUSCULAR | Status: AC
  Filled 2019-12-10: qty 1

## 2019-12-10 MED ORDER — PROMETHAZINE HCL 25 MG/ML IJ SOLN: 6.2500 mg | INTRAMUSCULAR | Status: DC | PRN

## 2019-12-10 MED ORDER — LIDOCAINE-EPINEPHRINE 2 %-1:100000 IJ SOLN: INTRAMUSCULAR | Status: DC | PRN

## 2019-12-10 MED ORDER — ACETAMINOPHEN 500 MG PO TABS: 1000.0000 mg | ORAL_TABLET | Freq: Three times a day (TID) | ORAL | 0 refills | Status: DC

## 2019-12-10 MED ORDER — FENTANYL CITRATE (PF) 100 MCG/2ML IJ SOLN
INTRAMUSCULAR | Status: AC
  Filled 2019-12-10: qty 2

## 2019-12-10 MED ORDER — ONDANSETRON HCL 4 MG/2ML IJ SOLN
INTRAMUSCULAR | Status: DC | PRN
  Administered 2019-12-10: 4 mg via INTRAVENOUS

## 2019-12-10 MED ORDER — BUPIVACAINE HCL (PF) 0.25 % IJ SOLN
INTRAMUSCULAR | Status: AC
  Filled 2019-12-10: qty 30

## 2019-12-10 MED ORDER — BUPIVACAINE HCL (PF) 0.25 % IJ SOLN
INTRAMUSCULAR | Status: DC | PRN
  Administered 2019-12-10: 30 mL

## 2019-12-10 MED ORDER — STERILE WATER FOR IRRIGATION IR SOLN
Status: DC | PRN
  Administered 2019-12-10: 1

## 2019-12-10 MED ORDER — MIDAZOLAM HCL 2 MG/2ML IJ SOLN
INTRAMUSCULAR | Status: DC | PRN
  Administered 2019-12-10 (×2): 1 mg via INTRAVENOUS

## 2019-12-10 MED ORDER — LIDOCAINE-EPINEPHRINE (PF) 2 %-1:200000 IJ SOLN
INTRAMUSCULAR | Status: DC | PRN
  Administered 2019-12-10 (×2): 10 mL via EPIDURAL

## 2019-12-10 MED ORDER — AMLODIPINE BESYLATE 5 MG PO TABS: 5.0000 mg | ORAL_TABLET | Freq: Every day | ORAL | 0 refills | Status: DC

## 2019-12-10 MED ORDER — FENTANYL CITRATE (PF) 100 MCG/2ML IJ SOLN
25.0000 ug | INTRAMUSCULAR | Status: DC | PRN
  Administered 2019-12-10 (×2): 25 ug via INTRAVENOUS

## 2019-12-10 MED ORDER — MIDAZOLAM HCL 2 MG/2ML IJ SOLN
INTRAMUSCULAR | Status: AC
  Filled 2019-12-10: qty 2

## 2019-12-10 MED ORDER — OXYCODONE HCL 5 MG PO TABS
5.0000 mg | ORAL_TABLET | ORAL | 0 refills | Status: DC | PRN
Start: 2019-12-10 — End: 2019-12-10

## 2019-12-10 MED ORDER — OXYCODONE HCL 5 MG PO TABS
10.0000 mg | ORAL_TABLET | Freq: Once | ORAL | Status: AC
  Administered 2019-12-10: 10 mg via ORAL

## 2019-12-10 MED FILL — oxyCODONE HCL 5 MG TABS: 5 | 5 days supply | Qty: 30 | Fill #0

## 2019-12-10 MED FILL — ACETAMINOPHEN 500MG XT STRE: 500 | 5 days supply | Qty: 30 | Fill #0

## 2019-12-10 MED FILL — AMLODIPINE BESYLATE 5 MG TA: 5 | 60 days supply | Qty: 60 | Fill #0

## 2019-12-10 SURGICAL SUPPLY — 21 items
BLADE SURG 11 STRL SS (BLADE) ×3 IMPLANT
CLIP FILSHIE TUBAL LIGA STRL (Clip) ×6 IMPLANT
DRSG OPSITE POSTOP 3X4 (GAUZE/BANDAGES/DRESSINGS) ×3 IMPLANT
DURAPREP 26ML APPLICATOR (WOUND CARE) ×3 IMPLANT
GLOVE BIOGEL PI IND STRL 7.0 (GLOVE) ×1 IMPLANT
GLOVE BIOGEL PI IND STRL 7.5 (GLOVE) ×1 IMPLANT
GLOVE BIOGEL PI INDICATOR 7.0 (GLOVE) ×2
GLOVE BIOGEL PI INDICATOR 7.5 (GLOVE) ×2
GLOVE ECLIPSE 7.5 STRL STRAW (GLOVE) ×3 IMPLANT
GOWN STRL REUS W/TWL LRG LVL3 (GOWN DISPOSABLE) ×6 IMPLANT
HIBICLENS CHG 4% 4OZ BTL (MISCELLANEOUS) ×3 IMPLANT
NEEDLE HYPO 22GX1.5 SAFETY (NEEDLE) ×3 IMPLANT
NS IRRIG 1000ML POUR BTL (IV SOLUTION) ×3 IMPLANT
PACK ABDOMINAL MINOR (CUSTOM PROCEDURE TRAY) ×3 IMPLANT
PROTECTOR NERVE ULNAR (MISCELLANEOUS) ×3 IMPLANT
SPONGE LAP 4X18 RFD (DISPOSABLE) IMPLANT
SUT VICRYL 0 UR6 27IN ABS (SUTURE) ×6 IMPLANT
SUT VICRYL 4-0 PS2 18IN ABS (SUTURE) ×3 IMPLANT
SYR CONTROL 10ML LL (SYRINGE) ×3 IMPLANT
TOWEL OR 17X24 6PK STRL BLUE (TOWEL DISPOSABLE) ×6 IMPLANT
TRAY FOLEY W/BAG SLVR 14FR (SET/KITS/TRAYS/PACK) ×3 IMPLANT

## 2019-12-10 NOTE — Op Note (Signed)
Kelly Willis  12/08/2019 - 12/10/2019  PREOPERATIVE DIAGNOSIS:  Multiparity, undesired fertility  POSTOPERATIVE DIAGNOSIS:  Multiparity, undesired fertility  PROCEDURE:  Postpartum Bilateral Tubal Sterilization using Filshie Clips  ANESTHESIA:  Epidural and local analgesia using 0.25% Marcaine  COMPLICATIONS:  None immediate.  ESTIMATED BLOOD LOSS: 5 ml.  INDICATIONS: 24 y.o. [id number]  with undesired fertility,status post vaginal delivery, desires permanent sterilization.  Other reversible forms of contraception were discussed with patient; she declines all other modalities. Risks of procedure discussed with patient including but not limited to: risk of regret, permanence of method, bleeding, infection, injury to surrounding organs and need for additional procedures.  Failure risk of 0.5-1% with increased risk of ectopic gestation if pregnancy occurs was also discussed with patient.     FINDINGS:  Normal uterus, tubes, and ovaries.  PROCEDURE DETAILS: The patient was taken to the operating room where her epidural was dosed up to surgical level and found to be adequate.  She was then placed in a supine position and prepped and draped in the usual sterile fashion.  After an adequate timeout was performed, attention was turned to the patient's abdomen where a small transverse skin incision was made under the umbilical fold. The incision was taken down to the layer of fascia using the scalpel, and fascia was incised, and extended bilaterally. The peritoneum was entered in a sharp fashion. The patient was placed in Trendelenburg.    A moist lap pad was used to move omentum and bowel away until the right fallopian tube was identified and grasped with a Babcock clamp, and followed out to the fimbriated end.  The right Fallopian tube was identified by tracing out to the fimbraie, grasped with the Babcock clamps. An avascular midsection of the tube approximately 3-4cm from the cornua was grasped with  the babcock clamps and the filshie clip was applied, taking care to incorporate the entire tube.  Attention was then turned to the left fallopian tube after confirmation by tracing the tube out to the fimbriae. The same procedure was then performed on the left Fallopian tube, with excellent hemostasis was noted from both BTL sites.  Good hemostasis was noted overall.  The instruments were then removed from the patient's abdomen and the fascial incision was repaired with 0 Vicryl, and the skin was closed with a 4-0 Vicryl subcuticular stitch. Local analgesia (marcaine 44ml) was injected into the incision site. The patient tolerated the procedure well.  Sponge, lap, and needle counts were correct times two.  The patient was then taken to the recovery room awake and in stable condition.  Sheila Oats, MD OB Fellow, Faculty Practice 12/10/2019 12:16 PM

## 2019-12-10 NOTE — Discharge Instructions (Signed)
Postpartum Care After Vaginal Delivery This sheet gives you information about how to care for yourself from the time you deliver your baby to up to 6-12 weeks after delivery (postpartum period). Your health care provider may also give you more specific instructions. If you have problems or questions, contact your health care provider. Follow these instructions at home: Vaginal bleeding  It is normal to have vaginal bleeding (lochia) after delivery. Wear a sanitary pad for vaginal bleeding and discharge. ? During the first week after delivery, the amount and appearance of lochia is often similar to a menstrual period. ? Over the next few weeks, it will gradually decrease to a dry, yellow-brown discharge. ? For most women, lochia stops completely by 4-6 weeks after delivery. Vaginal bleeding can vary from woman to woman.  Change your sanitary pads frequently. Watch for any changes in your flow, such as: ? A sudden increase in volume. ? A change in color. ? Large blood clots.  If you pass a blood clot from your vagina, save it and call your health care provider to discuss. Do not flush blood clots down the toilet before talking with your health care provider.  Do not use tampons or douches until your health care provider says this is safe.  If you are not breastfeeding, your period should return 6-8 weeks after delivery. If you are feeding your child breast milk only (exclusive breastfeeding), your period may not return until you stop breastfeeding. Perineal care  Keep the area between the vagina and the anus (perineum) clean and dry as told by your health care provider. Use medicated pads and pain-relieving sprays and creams as directed.  If you had a cut in the perineum (episiotomy) or a tear in the vagina, check the area for signs of infection until you are healed. Check for: ? More redness, swelling, or pain. ? Fluid or blood coming from the cut or tear. ? Warmth. ? Pus or a bad  smell.  You may be given a squirt bottle to use instead of wiping to clean the perineum area after you go to the bathroom. As you start healing, you may use the squirt bottle before wiping yourself. Make sure to wipe gently.  To relieve pain caused by an episiotomy, a tear in the vagina, or swollen veins in the anus (hemorrhoids), try taking a warm sitz bath 2-3 times a day. A sitz bath is a warm water bath that is taken while you are sitting down. The water should only come up to your hips and should cover your buttocks. Breast care  Within the first few days after delivery, your breasts may feel heavy, full, and uncomfortable (breast engorgement). Milk may also leak from your breasts. Your health care provider can suggest ways to help relieve the discomfort. Breast engorgement should go away within a few days.  If you are breastfeeding: ? Wear a bra that supports your breasts and fits you well. ? Keep your nipples clean and dry. Apply creams and ointments as told by your health care provider. ? You may need to use breast pads to absorb milk that leaks from your breasts. ? You may have uterine contractions every time you breastfeed for up to several weeks after delivery. Uterine contractions help your uterus return to its normal size. ? If you have any problems with breastfeeding, work with your health care provider or lactation consultant.  If you are not breastfeeding: ? Avoid touching your breasts a lot. Doing this can make   your breasts produce more milk. ? Wear a good-fitting bra and use cold packs to help with swelling. ? Do not squeeze out (express) milk. This causes you to make more milk. Intimacy and sexuality  Ask your health care provider when you can engage in sexual activity. This may depend on: ? Your risk of infection. ? How fast you are healing. ? Your comfort and desire to engage in sexual activity.  You are able to get pregnant after delivery, even if you have not had  your period. If desired, talk with your health care provider about methods of birth control (contraception). Medicines  Take over-the-counter and prescription medicines only as told by your health care provider.  If you were prescribed an antibiotic medicine, take it as told by your health care provider. Do not stop taking the antibiotic even if you start to feel better. Activity  Gradually return to your normal activities as told by your health care provider. Ask your health care provider what activities are safe for you.  Rest as much as possible. Try to rest or take a nap while your baby is sleeping. Eating and drinking   Drink enough fluid to keep your urine pale yellow.  Eat high-fiber foods every day. These may help prevent or relieve constipation. High-fiber foods include: ? Whole grain cereals and breads. ? Brown rice. ? Beans. ? Fresh fruits and vegetables.  Do not try to lose weight quickly by cutting back on calories.  Take your prenatal vitamins until your postpartum checkup or until your health care provider tells you it is okay to stop. Lifestyle  Do not use any products that contain nicotine or tobacco, such as cigarettes and e-cigarettes. If you need help quitting, ask your health care provider.  Do not drink alcohol, especially if you are breastfeeding. General instructions  Keep all follow-up visits for you and your baby as told by your health care provider. Most women visit their health care provider for a postpartum checkup within the first 3-6 weeks after delivery. Contact a health care provider if:  You feel unable to cope with the changes that your child brings to your life, and these feelings do not go away.  You feel unusually sad or worried.  Your breasts become red, painful, or hard.  You have a fever.  You have trouble holding urine or keeping urine from leaking.  You have little or no interest in activities you used to enjoy.  You have not  breastfed at all and you have not had a menstrual period for 12 weeks after delivery.  You have stopped breastfeeding and you have not had a menstrual period for 12 weeks after you stopped breastfeeding.  You have questions about caring for yourself or your baby.  You pass a blood clot from your vagina. Get help right away if:  You have chest pain.  You have difficulty breathing.  You have sudden, severe leg pain.  You have severe pain or cramping in your lower abdomen.  You bleed from your vagina so much that you fill more than one sanitary pad in one hour. Bleeding should not be heavier than your heaviest period.  You develop a severe headache.  You faint.  You have blurred vision or spots in your vision.  You have bad-smelling vaginal discharge.  You have thoughts about hurting yourself or your baby. If you ever feel like you may hurt yourself or others, or have thoughts about taking your own life, get help   right away. You can go to the nearest emergency department or call:  Your local emergency services (911 in the [place]  A suicide crisis helpline, such as the National Suicide Prevention Lifeline at 1-800-273-8255. This is open 24 hours a day. Summary  The period of time right after you deliver your newborn up to 6-12 weeks after delivery is called the postpartum period.  Gradually return to your normal activities as told by your health care provider.  Keep all follow-up visits for you and your baby as told by your health care provider. This information is not intended to replace advice given to you by your health care provider. Make sure you discuss any questions you have with your health care provider. Document Revised: 01/31/2017 Document Reviewed: 11/11/2016 Elsevier Patient Education  2020 [employer]   Tubal Ligation Reversal, Care After This sheet gives you information about how to care for yourself after your procedure. Your health care provider may also  give you more specific instructions. If you have problems or questions, contact your health care provider. What can I expect after the procedure? After the procedure, it is common to have:  Mild abdominal discomfort. This can include: ? Mild cramping. ? Gas pains or feeling bloated. ? Pain or soreness at the incision areas.  Discomfort in the shoulder area. This is caused by air that is trapped between your liver and your diaphragm. The discomfort will slowly go away on its own.  Tiredness.  A sore throat if you had a breathing tube.  Nausea or vomiting. Follow these instructions at home: Medicines  Take over-the-counter and prescription medicines only as told by your health care provider.  Do not take aspirin because it can cause bleeding.  Do not drive or use heavy machinery while taking prescription pain medicine. Activity  Rest as told by your health care provider. Gradually return to your normal activities as told by your health care provider.  Avoid sitting for a long time without moving. Get up and move around one or more times every few hours.  Do not douche, use tampons, or have sexual intercourse for an entire menstrual cycle, or as told by your health care provider.  Do not lift anything that is heavier than 10 lb (4.5 kg), or as told by your health care provider.  Have someone help you with your daily household tasks for the first 7-10 days, or as recommended by your health care provider. Incision care   Follow instructions from your health care provider about how to take care of your incision. Make sure you: ? Wash your hands with soap and water before you change your bandage (dressing). If soap and water are not available, use hand sanitizer. ? Change your dressing as told by your health care provider. ? Leave stitches (sutures), skin glue, or adhesive strips in place. These skin closures may need to stay in place for 2 weeks or longer. If adhesive strip edges  start to loosen and curl up, you may trim the loose edges.  Do not take baths, swim, or use a hot tub until your health care provider approves. You may take showers.  Check your incision area every day for signs of infection. Check for: ? Redness, swelling, or pain. ? Fluid or blood. ? Warmth. ? Pus or a bad smell. General instructions  To prevent or treat constipation while you are taking prescription pain medicine, your health care provider may recommend that you: ? Drink enough fluid to keep your   urine pale yellow. ? Take over-the-counter or prescription medicines. ? Eat foods that are high in fiber, such as fresh fruits and vegetables, whole grains, and beans. ? Limit foods that are high in fat and processed sugars, such as fried and sweet foods.  Do not use any products that contain nicotine or tobacco, such as cigarettes and e-cigarettes. These can delay healing. If you need help quitting, ask your health care provider.  Keep all follow-up visits as told by your health care provider. This is important. You will need to have a follow-up X-ray dye test about 3-4 months after the surgery to make sure that your tubes are open. Contact a health care provider if:  You have signs of infection, such as: ? Redness, swelling, or pain around your incision sites. ? Fluid or blood coming from an incision. ? An incision that feels warm to the touch. ? Pus or a bad smell coming from an incision.  Your incision breaks open.  You have a fever.  You have a rash.  You feel dizzy or lightheaded.  You cannot eat or drink without vomiting.  You are constipated. Get help right away if:  You have shortness of breath or difficulty breathing.  You have chest or leg pain.  You repeatedly feel lightheaded.  You faint.  You have increasing pain in your abdomen.  You feel a burning sensation or pain when you urinate.  You have heavy vaginal bleeding when it is not time for your menstrual  period. Summary  It is common to have a sore throat, mild abdominal pain, and fatigue after the procedure.  Take over-the-counter and prescription medicines only as told by your health care provider.  Keep all follow-up visits as told by your health care provider. This is important. You will need to have a follow-up X-ray dye test about 3-4 months after the surgery to make sure that your tubes are open. This information is not intended to replace advice given to you by your health care provider. Make sure you discuss any questions you have with your health care provider. Document Revised: 01/10/2017 Document Reviewed: 05/09/2016 Elsevier Patient Education  2020 Elsevier Inc.  

## 2019-12-10 NOTE — Discharge Summary (Signed)
Postpartum Discharge Summary    Patient Name: Kelly Willis DOB: [birth year] MRN: [id number]  Date of admission: 12/08/2019 Delivery date:12/08/2019  Delivering provider: [facility], Joneen Caraway C  Date of discharge: 12/10/2019  Admitting diagnosis: Normal labor [O80, Z37.9] Intrauterine pregnancy: 108w5d    Secondary diagnosis:  Principal Problem:   Vaginal delivery Active Problems:   Limited prenatal care, antepartum   Obesity in pregnancy   Chronic hypertension affecting pregnancy   Anemia in pregnancy   Alpha thalassemia silent carrier   History of bilateral tubal ligation  Additional problems: as noted above  Discharge diagnosis: Vaginal delivery                                      Post partum procedures:postpartum tubal ligation Augmentation: none Complications: None  Hospital course: Onset of Labor With Vaginal Delivery      24y.o. yo [id number] [id number] admitted in Latent Labor on 12/08/2019. Patient had an uncomplicated labor course as follows:  Membrane Rupture Time/Date: 12:41 PM ,12/08/2019   Delivery Method:Vaginal, Spontaneous  Episiotomy: None  Lacerations:  1st degree;Perineal  Patient had an uncomplicated postpartum course.  She is ambulating, tolerating a regular diet, passing flatus, and urinating well. Patient is discharged home in stable condition on 12/10/19.  Newborn Data: Birth date:12/08/2019  Birth time:5:52 PM  Gender:Female  Living status:Living  Apgars:9 ,9  Weight:2931 g   Magnesium Sulfate received: No BMZ received: No Rhophylac:N/A MMR:N/A T-DaP:Given prenatally Flu: No Transfusion:No  Physical exam  Vitals:   12/10/19 1357 12/10/19 1410 12/10/19 1530 12/10/19 1541  BP: 132/66 132/66 (!) 148/86 (!) 148/86  Pulse: 69 71 86   Resp: _0 Temp: 98 F (36.7 C) 98 F (36.7 C) 98.1 F (36.7 C)   TempSrc: Oral Oral Oral   SpO2: 100% 100% 100%   Weight:      Height:       General: alert, cooperative and no distress Lochia:  appropriate Uterine Fundus: firm Incision: Dressing is clean, dry, and intact DVT Evaluation: No evidence of DVT seen on physical exam. No cords or calf tenderness. No significant calf/ankle edema. Labs: Lab Results  Component Value Date   WBC 8.8 12/08/2019   HGB 11.3 (L) 12/08/2019   HCT 37.2 12/08/2019   MCV 88.2 12/08/2019   PLT 257 12/08/2019   CMP Latest Ref Rng & Units 09/17/2019  Glucose 65 - 99 mg/dL 115(H)  BUN 6 - 20 mg/dL 7  Creatinine 0.57 - 1.00 mg/dL 0.66  Sodium 134 - 144 mmol/L 135  Potassium 3.5 - 5.2 mmol/L 3.8  Chloride 96 - 106 mmol/L 103  CO2 20 - 29 mmol/L 21  Calcium 8.7 - 10.2 mg/dL 8.9  Total Protein 6.0 - 8.5 g/dL 6.6  Total Bilirubin 0.0 - 1.2 mg/dL 0.3  Alkaline Phos 48 - 121 IU/L 121  AST 0 - 40 IU/L 18  ALT 0 - 32 IU/L 17   Edinburgh Score: Edinburgh Postnatal Depression Scale Screening Tool 12/09/2019  I have been able to laugh and see the funny side of things. 2  I have looked forward with enjoyment to things. 1  I have blamed myself unnecessarily when things went wrong. 1  I have been anxious or worried for no good reason. 2  I have felt scared or panicky for no good reason. 1  Things have been getting on top of  me. 2  I have been so unhappy that I have had difficulty sleeping. 2  I have felt sad or miserable. 2  I have been so unhappy that I have been crying. 2  The thought of harming myself has occurred to me. 0  Edinburgh Postnatal Depression Scale Total 15     After visit meds:  Allergies as of 12/10/2019      Reactions   Pepto-bismol [bismuth Subsalicylate] Nausea And Vomiting      Medication List    STOP taking these medications   aspirin EC 81 MG tablet     TAKE these medications   acetaminophen 500 MG tablet Commonly known as: TYLENOL Take 2 tablets (1,000 mg total) by mouth every 8 (eight) hours.   amLODipine 5 MG tablet Commonly known as: NORVASC Take 1 tablet (5 mg total) by mouth daily.   Blood Pressure Kit  Devi 1 Device by Does not apply route as needed.   oxyCODONE 5 MG immediate release tablet Commonly known as: Oxy IR/ROXICODONE Take 1 tablet (5 mg total) by mouth every 4 (four) hours as needed for severe pain.        Discharge home in stable condition Infant Feeding: Bottle Infant Disposition:home with mother Discharge instruction: per After Visit Summary and Postpartum booklet. Activity: Advance as tolerated. Pelvic rest for 6 weeks.  Diet: routine diet Future Appointments:No future appointments. Follow up Visit: Message sent to [facility] clinic on 12/10/19.  Please schedule this patient for a In person postpartum visit in 4 weeks with the following provider: Any provider. Additional Postpartum F/U:Incision check 1 week  High risk pregnancy complicated by: obesity in pregnancy (BMI 51 at time of delivery), silent alpha thal carrier, anti-M antibody Delivery mode:  Vaginal, Spontaneous  Anticipated Birth Control:  BTL done [facility]   12/10/2019 Randa Ngo, MD

## 2019-12-10 NOTE — Anesthesia Preprocedure Evaluation (Signed)
Anesthesia Evaluation  Patient identified by MRN, date of birth, ID band Patient awake    Reviewed: Allergy & Precautions, H&P , NPO status , Patient's Chart, lab work & pertinent test results  History of Anesthesia Complications Negative for: history of anesthetic complications  Airway Mallampati: II  TM Distance: >3 FB Neck ROM: full    Dental no notable dental hx. (+) Teeth Intact   Pulmonary former smoker,    Pulmonary exam normal breath sounds clear to auscultation       Cardiovascular hypertension, Normal cardiovascular exam Rhythm:regular Rate:Normal     Neuro/Psych PSYCHIATRIC DISORDERS ADHD (attention deficit hyperactivity disorder)negative neurological ROS     GI/Hepatic negative GI ROS, Neg liver ROS,   Endo/Other  diabetesMorbid obesity (SUPER)  Renal/GU negative Renal ROS  negative genitourinary   Musculoskeletal   Abdominal   Peds  Hematology  (+) anemia ,   Anesthesia Other Findings   Reproductive/Obstetrics (+) Pregnancy                             Anesthesia Physical  Anesthesia Plan  ASA: IV  Anesthesia Plan: Epidural   Post-op Pain Management:    Induction:   PONV Risk Score and Plan:   Airway Management Planned:   Additional Equipment:   Intra-op Plan:   Post-operative Plan:   Informed Consent: I have reviewed the patients History and Physical, chart, labs and discussed the procedure including the risks, benefits and alternatives for the proposed anesthesia with the patient or authorized representative who has indicated his/her understanding and acceptance.       Plan Discussed with: Anesthesiologist and CRNA  Anesthesia Plan Comments: (Will plan to dose epidural for surgical anesthesia. Backup plan will be spinal. )        Anesthesia Quick Evaluation

## 2019-12-10 NOTE — Progress Notes (Signed)
Risks of procedure discussed with patient including but not limited to: risk of regret, permanence of method, bleeding, infection, injury to surrounding organs and need for additional procedures.  Failure risk of 1 -2 % with increased risk of ectopic gestation if pregnancy occurs was also discussed with patient.    Levie Heritage, DO 12/10/2019 8:56 AM

## 2019-12-10 NOTE — Transfer of Care (Signed)
Immediate Anesthesia Transfer of Care Note  Patient: Kelly Willis  Procedure(s) Performed: POST PARTUM TUBAL LIGATION (N/A )  Patient Location: PACU  Anesthesia Type:Epidural  Level of Consciousness: awake, alert  and patient cooperative  Airway & Oxygen Therapy: Patient Spontanous Breathing  Post-op Assessment: Report given to RN and Post -op Vital signs reviewed and stable  Post vital signs: Reviewed and stable  Last Vitals:  Vitals Value Taken Time  BP 123/73 12/10/19 1226  Temp    Pulse 74 12/10/19 1228  Resp 19 12/10/19 1228  SpO2 100 % 12/10/19 1228  Vitals shown include unvalidated device data.  Last Pain:  Vitals:   12/10/19 0949  TempSrc: Oral  PainSc:       Patients Stated Pain Goal: 3 (12/10/19 0905)  Complications: No complications documented.

## 2019-12-10 NOTE — Progress Notes (Signed)
CSW spoke with Avel Sensor at [facility] and was advised that all of MOB's cases have been closed as of 2019. No barrier's to d/c.    Claude Manges. Jatniel Verastegui, MSW, LCSW [facility] [place] [phone]

## 2019-12-10 NOTE — Progress Notes (Addendum)
Post Partum Day 2 Subjective: no complaints, up ad lib, voiding and tolerating PO  Objective: Blood pressure 129/75, pulse 87, temperature 97.7 F (36.5 C), temperature source Oral, resp. rate 18, height 5\' 6"  (1.676 m), weight (!) 140.6 kg, last menstrual period 03/08/2019, SpO2 100 %, unknown if currently breastfeeding.  Physical Exam:  General: alert and no distress Lochia: appropriate Uterine Fundus: firm Incision: n/a DVT Evaluation: No evidence of DVT seen on physical exam. No cords or calf tenderness. No significant calf/ankle edema.  Recent Labs    12/08/19 1353  HGB 11.3*  HCT 37.2    Assessment/Plan: Plan for discharge later today and Contraception plan for BTL today PPD#2  #CHTN: BP prenatal wnl w/o meds. 152/74 on admission. Monitor, Pre- E labs wnl, Bps normal on Norvasc 5 mg daily   #Hx of GDM: normal GTT this pregancy   LOS: 2 days   12/10/19 12/10/2019, 9:07 AM   I saw and evaluated the patient. I agree with the findings and the plan of care as documented in the resident's note.  12/12/2019, MD [facility] Family Medicine Fellow, [facility] for [facility], [facility]

## 2019-12-10 NOTE — Clinical Social Work Maternal (Signed)
CLINICAL SOCIAL WORK MATERNAL/CHILD NOTE  Patient Details  Name: Kelly Willis MRN: [id number] Date of Birth: 06/15/1995  Date:  12/10/2019  Clinical Social Worker Initiating Note:  Margan Elias, LCSW Date/Time: Initiated:  12/10/19/0930     Child's Name:  Kelly Willis   Biological Parents:  Mother, Father (Kelly Willis, Kelly Willis)   Need for Interpreter:  None   Reason for Referral:  Current Substance Use/Substance Use During Pregnancy  (edinburgh score 15)   Address:  [address] [place] [address]    Phone number:  [phone] (home)     Additional phone number: none   Household Members/Support Persons (HM/SP):   Household Member/Support Person 1, Household Member/Support Person 2   HM/SP Name Relationship DOB or Age  HM/SP -1  Kelly Willis MOB 09/18/1994  HM/SP -2 Kelly Willis FOB 02/23/1994  HM/SP -3 Kelly Willis  daughter 01/21/2016   HM/SP -4 Kelly Willis son 01/03/2018  HM/SP -5        HM/SP -6        HM/SP -7        HM/SP -8          Natural Supports (not living in the home):  Immediate Family   Professional Supports: None   Employment: Other (comment)   Type of Work: on leave from [employer] per MOB.   Education:    High School Graduate   Homebound arranged:  n/a  Financial Resources:  Private Insurance ([employer]   Other Resources:  WIC, Food Stamps    Cultural/Religious Considerations Which May Impact Care:  none reported.   Strengths:  Ability to meet basic needs , Compliance with medical plan , Home prepared for child , Pediatrician chosen   Psychotropic Medications:     None reported.     Pediatrician:    [place] area  Pediatrician List:   [facility]  [place]    [place]    [place]     County    [place]      Pediatrician Fax Number:    Risk Factors/Current Problems:  Other(Comment)    Cognitive State:  Able to Concentrate , Insightful , Alert    Mood/Affect:  Calm , Relaxed , Comfortable , Interested    CSW Assessment: CSW consulted due to drug exposed New Born as well as MOB scoring 15 on Edinburgh. CSW completed chart review, and was unable to locate positive screens for MOB during this pregnancy. CSW went to speak with MOB at bedside to address further needs.   CSW congratulated MOB on the birth of infant. CSW advised MOB of CSW's role and the reason for CSW coming to speak with her. MOB reported that she has been very overwhelmed the last 7 days. MOB expressed that she was "gettign very aggravated about things" and would feel depressed. MOB reported that she has periods of isolating herself and "not wanting to be bothered". CSW asked MOB if she has any mental health hx in which MOB reported that she does not. MOB Expressed that she has been in therapy in the past but reported none recently and declined resources when asked by CSW. MOB expressed that she thinks that she was over pregnancy as MOB reported that since gibing birth she has felt "great". MOB reported no SI, HI or DV to CSW when asked.   CSW inquired from MOB on substance use while pregnant. MOB reported that she has THC use with other   pregnancy but reported that she didn't use anything while pregnant this time. MOB expressed that she did have CPS hx due to that in the past but reported that cases were closed "right before COVID". CSW understanding of this and notes no other mentions of THC use while pregnant. CSW was advised that MOB has support from her sister. MOB expressed that she is from home with boyfriend and expressed that she and FOB have all needed items to care for infant. MOB expressed that she has a basinet for infant to  sleep in once arrived home and will have follow up care at [facility].  CSW took time to provide MOB with PPD and SIDS education. MOB was given PPD Checklist to  further assess feelings as they may relate to PPD. NO barrier's to d/c.   CSW Plan/Description:  No Further Intervention Required/No Barriers to Discharge, Sudden Infant Death Syndrome (SIDS) Education, Perinatal Mood and Anxiety Disorder (PMADs) Education    Kelly Willis, LCSWA 12/10/2019, 9:43 AM 

## 2019-12-10 NOTE — Anesthesia Postprocedure Evaluation (Signed)
Anesthesia Post Note  Patient: Kelly Willis  Procedure(s) Performed: POST PARTUM TUBAL LIGATION (N/A )     Patient location during evaluation: PACU Anesthesia Type: Epidural Level of consciousness: awake and alert Pain management: pain level controlled Vital Signs Assessment: post-procedure vital signs reviewed and stable Respiratory status: spontaneous breathing, nonlabored ventilation and respiratory function stable Cardiovascular status: stable Postop Assessment: no headache, no backache and epidural receding Anesthetic complications: no   No complications documented.  Last Vitals:  Vitals:   12/10/19 1245 12/10/19 1300  BP: 133/61 122/68  Pulse: 78 73  Resp: 18 17  Temp:    SpO2: 95% 94%    Last Pain:  Vitals:   12/10/19 1300  TempSrc:   PainSc: Asleep   Pain Goal: Patients Stated Pain Goal: 3 (12/10/19 0905)  LLE Motor Response: Purposeful movement (12/10/19 1300) LLE Sensation: Tingling (12/10/19 1300) RLE Motor Response: Purposeful movement (12/10/19 1300) RLE Sensation: Tingling (12/10/19 1300)     Epidural/Spinal Function Cutaneous sensation: Pins and Needles (12/10/19 1230), Patient able to flex knees: No (12/10/19 1230), Patient able to lift hips off bed: No (12/10/19 1230), Back pain beyond tenderness at insertion site: No (12/10/19 1230), Progressively worsening motor and/or sensory loss: No (12/10/19 1230), Bowel and/or bladder incontinence post epidural: No (12/10/19 1230)  Mellody Dance

## 2019-12-11 LAB — TYPE AND SCREEN
ABO/RH(D): O POS
Antibody Screen: POSITIVE
Unit division: 0
Unit division: 0

## 2019-12-11 LAB — BPAM RBC
Blood Product Expiration Date: 202111062359
Blood Product Expiration Date: 202111062359
Unit Type and Rh: 5100
Unit Type and Rh: 5100

## 2019-12-14 ENCOUNTER — Encounter (HOSPITAL_COMMUNITY): Payer: Self-pay | Admitting: Family Medicine

## 2019-12-14 NOTE — Addendum Note (Signed)
Addendum  created 12/14/19 0930 by Mellody Dance, MD   Intraprocedure Event deleted

## 2019-12-21 ENCOUNTER — Encounter: Payer: Self-pay | Admitting: *Deleted

## 2019-12-23 ENCOUNTER — Ambulatory Visit: Payer: 59

## 2019-12-23 ENCOUNTER — Other Ambulatory Visit: Payer: Self-pay

## 2020-01-10 ENCOUNTER — Ambulatory Visit: Payer: 59 | Admitting: Obstetrics and Gynecology

## 2020-01-19 ENCOUNTER — Encounter: Payer: Self-pay | Admitting: General Practice

## 2020-02-11 ENCOUNTER — Other Ambulatory Visit: Payer: Self-pay

## 2020-02-11 ENCOUNTER — Encounter (HOSPITAL_COMMUNITY): Payer: Self-pay | Admitting: Emergency Medicine

## 2020-02-11 ENCOUNTER — Ambulatory Visit (HOSPITAL_COMMUNITY)
Admission: EM | Admit: 2020-02-11 | Discharge: 2020-02-11 | Disposition: A | Discharge status: Home / Self Care | Payer: 59 | Attending: Student | Admitting: Student

## 2020-02-11 DIAGNOSIS — J069 Acute upper respiratory infection, unspecified: Secondary | ICD-10-CM

## 2020-02-11 DIAGNOSIS — U071 COVID-19: Secondary | ICD-10-CM | POA: Diagnosis not present

## 2020-02-11 DIAGNOSIS — F909 Attention-deficit hyperactivity disorder, unspecified type: Secondary | ICD-10-CM | POA: Diagnosis not present

## 2020-02-11 DIAGNOSIS — E669 Obesity, unspecified: Secondary | ICD-10-CM | POA: Insufficient documentation

## 2020-02-11 DIAGNOSIS — Z283 Underimmunization status: Secondary | ICD-10-CM | POA: Diagnosis not present

## 2020-02-11 DIAGNOSIS — I10 Essential (primary) hypertension: Secondary | ICD-10-CM | POA: Diagnosis not present

## 2020-02-11 DIAGNOSIS — Z79899 Other long term (current) drug therapy: Secondary | ICD-10-CM | POA: Insufficient documentation

## 2020-02-11 DIAGNOSIS — Z87891 Personal history of nicotine dependence: Secondary | ICD-10-CM | POA: Insufficient documentation

## 2020-02-11 DIAGNOSIS — R509 Fever, unspecified: Secondary | ICD-10-CM | POA: Diagnosis present

## 2020-02-11 LAB — RESP PANEL BY RT-PCR (FLU A&B, COVID) ARPGX2
Influenza A by PCR: NEGATIVE
Influenza B by PCR: NEGATIVE
SARS Coronavirus 2 by RT PCR: POSITIVE — AB

## 2020-02-11 MED ORDER — PROMETHAZINE-DM 6.25-15 MG/5ML PO SYRP: 5.0000 mL | ORAL_SOLUTION | Freq: Four times a day (QID) | ORAL | 0 refills | Status: DC | PRN

## 2020-02-11 NOTE — ED Provider Notes (Signed)
Marie    CSN: [id number] Arrival date & time: 02/11/20  1518      History   Chief Complaint Chief Complaint  Patient presents with  . URI    HPI Kelly Willis is a 24 y.o. female  Presenting for URI symptoms for 2 days. History of adhd, obesity, hypertension. Presenting today for fevers (100.4 at home), headaches, chills, body aches, fatigue. Denies n/v/d, shortness of breath, chest pain, cough, congestion, facial pain, teeth pain, headaches, sore throat, loss of taste/smell, swollen lymph nodes, ear pain. Denies chest pain, shortness of breath, confusion, high fevers. Not vaccinated for covid-19.   HPI  Past Medical History:  Diagnosis Date  . ADHD (attention deficit hyperactivity disorder)   . Gestational diabetes   . Obesity   . Pilonidal abscess 10/16/2012  . Pilonidal cyst 06/2014    Patient Active Problem List   Diagnosis Date Noted  . History of bilateral tubal ligation 12/10/2019  . Vaginal delivery 12/08/2019  . Alpha thalassemia silent carrier 10/06/2019  . Anemia in pregnancy 09/20/2019  . Chronic hypertension affecting pregnancy 09/17/2019  . BMI 50.0-59.9, adult ([place]) 09/17/2019  . Supervision of high risk pregnancy, antepartum 07/07/2019  . Obesity in pregnancy   . History of gestational diabetes mellitus 10/20/2017  . Lesion of skin of breast 10/20/2017  . Limited prenatal care, antepartum 10/20/2017  . Weight loss 10/20/2017  . 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 NO PAST SURGERIES    . PILONIDAL CYST EXCISION    . PILONIDAL CYST EXCISION N/A 07/08/2014   Procedure: CYST EXCISION PILONIDAL ;  Surgeon: Jackolyn Confer, MD;  Location: [place];  Service: General;  Laterality: N/A;  . TUBAL LIGATION N/A 12/10/2019   Procedure: POST PARTUM TUBAL LIGATION;  Surgeon: Truett Mainland, DO;  [facility]: [facility];  Service:  Gynecology;  Laterality: N/A;    OB History    Gravida  4   Para  3   Term  3   Preterm  0   AB  1   Living  3     SAB  0   IAB  0   Ectopic  0   Multiple  0   Live Births  3            Home Medications    Prior to Admission medications   Medication Sig Start Date End Date Taking? Authorizing Provider  promethazine-dextromethorphan (PROMETHAZINE-DM) 6.25-15 MG/5ML syrup Take 5 mLs by mouth 4 (four) times daily as needed for cough. 02/11/20  Yes Hazel Sams, PA-C  acetaminophen (TYLENOL) 500 MG tablet Take 2 tablets (1,000 mg total) by mouth every 8 (eight) hours. 12/10/19   Myrtis Ser, CNM  amLODipine (NORVASC) 5 MG tablet Take 1 tablet (5 mg total) by mouth daily. 12/10/19   Myrtis Ser, CNM  Blood Pressure Monitoring (BLOOD PRESSURE KIT) DEVI 1 Device by Does not apply route as needed. 07/07/19   Luvenia Redden, PA-C  oxyCODONE (OXY IR/ROXICODONE) 5 MG immediate release tablet Take 1 tablet (5 mg total) by mouth every 4 (four) hours as needed for severe pain. 12/10/19   Myrtis Ser, CNM    Family History Family History  Problem Relation Age of Onset  . Hypertension Maternal Grandfather     Social History Social History   Tobacco Use  . Smoking status: Former Smoker    Years:  2.00    Types: Cigarettes    Quit date: 04/15/2017    Years since quitting: 2.8  . Smokeless tobacco: Never Used  . Tobacco comment: 4 cig./day  Vaping Use  . Vaping Use: Never used  Substance Use Topics  . Alcohol use: Not Currently    Comment: occasionally  . Drug use: Yes    Types: Marijuana    Comment: daily 1.5 per day     Allergies   Pepto-bismol [bismuth subsalicylate]   Review of Systems Review of Systems  Constitutional: Positive for chills, fatigue and fever. Negative for appetite change.  HENT: Negative for congestion, ear pain, rhinorrhea, sinus pressure, sinus pain and sore throat.   Eyes: Negative for redness and visual disturbance.   Respiratory: Negative for cough, chest tightness, shortness of breath and wheezing.   Cardiovascular: Negative for chest pain and palpitations.  Gastrointestinal: Negative for abdominal pain, constipation, diarrhea, nausea and vomiting.  Genitourinary: Negative for dysuria, frequency and urgency.  Musculoskeletal: Positive for myalgias.  Neurological: Negative for dizziness, weakness and headaches.  Psychiatric/Behavioral: Negative for confusion.  All other systems reviewed and are negative.    Physical Exam Triage Vital Signs ED Triage Vitals  Enc Vitals Group     BP 02/11/20 1612 (!) 164/97     Pulse Rate 02/11/20 1612 (!) 103     Resp 02/11/20 1612 18     Temp 02/11/20 1612 99.1 F (37.3 C)     Temp Source 02/11/20 1612 Oral     SpO2 02/11/20 1612 100 %     Weight --      Height --      Head Circumference --      Peak Flow --      Pain Score 02/11/20 1611 7     Pain Loc --      Pain Edu? --      Excl. in [place]? --    No data found.  Updated Vital Signs BP (!) 164/97 (BP Location: Right Arm)   Pulse (!) 103   Temp 99.1 F (37.3 C) (Oral)   Resp 18   LMP 02/11/2020   SpO2 100%   Breastfeeding No   Visual Acuity Right Eye Distance:   Left Eye Distance:   Bilateral Distance:    Right Eye Near:   Left Eye Near:    Bilateral Near:     Physical Exam Vitals reviewed.  Constitutional:      General: She is not in acute distress.    Appearance: Normal appearance. She is not ill-appearing.  HENT:     Head: Normocephalic and atraumatic.     Right Ear: Hearing, tympanic membrane, ear canal and external ear normal. No swelling or tenderness. There is no impacted cerumen. No mastoid tenderness. Tympanic membrane is not perforated, erythematous, retracted or bulging.     Left Ear: Hearing, tympanic membrane, ear canal and external ear normal. No swelling or tenderness. There is no impacted cerumen. No mastoid tenderness. Tympanic membrane is not perforated, erythematous,  retracted or bulging.     Nose:     Right Sinus: No maxillary sinus tenderness or frontal sinus tenderness.     Left Sinus: No maxillary sinus tenderness or frontal sinus tenderness.     Mouth/Throat:     Mouth: Mucous membranes are moist.     Pharynx: Uvula midline. No oropharyngeal exudate or posterior oropharyngeal erythema.     Tonsils: No tonsillar exudate.  Cardiovascular:     Rate and Rhythm: Normal  rate and regular rhythm.     Heart sounds: Normal heart sounds.  Pulmonary:     Breath sounds: Normal breath sounds and air entry. No wheezing, rhonchi or rales.  Chest:     Chest wall: No tenderness.  Abdominal:     General: Abdomen is flat. Bowel sounds are normal.     Tenderness: There is no abdominal tenderness. There is no guarding or rebound.  Lymphadenopathy:     Cervical: No cervical adenopathy.  Neurological:     General: No focal deficit present.     Mental Status: She is alert and oriented to person, place, and time.  Psychiatric:        Attention and Perception: Attention and perception normal.        Mood and Affect: Mood and affect normal.        Behavior: Behavior normal. Behavior is cooperative.        Thought Content: Thought content normal.        Judgment: Judgment normal.      UC Treatments / Results  Labs (all labs ordered are listed, but only abnormal results are displayed) Labs Reviewed  RESP PANEL BY RT-PCR (FLU A&B, COVID) [id number]    EKG   Radiology No results found.  Procedures Procedures (including critical care time)  Medications Ordered in UC Medications - No data to display  Initial Impression / Assessment and Plan / UC Course  I have reviewed the triage vital signs and the nursing notes.  Pertinent labs & imaging results that were available during my care of the patient were reviewed by me and considered in my medical decision making (see chart for details).     Covid and influenza tests sent today. Patient is not vaccinated  for covid-19. Isolation precautions per CDC guidelines until negative result. Symptomatic relief with OTC Mucinex, Nyquil, etc. Promethazine DM as below.  Return precautions- new/worsening fevers/chills, shortness of breath, chest pain, abd pain, etc.   Final Clinical Impressions(s) / UC Diagnoses   Final diagnoses:  Acute upper respiratory infection     Discharge Instructions     -Tylenol/ibuprofen for fevers,chills, and body aches -Promethazine DM cough syrup for congestion and cough -Make sure to rest and drink plenty of fluids.    ED Prescriptions    Medication Sig Dispense Auth. Provider   promethazine-dextromethorphan (PROMETHAZINE-DM) 6.25-15 MG/5ML syrup Take 5 mLs by mouth 4 (four) times daily as needed for cough. 118 mL Hazel Sams, PA-C     PDMP not reviewed this encounter.   Hazel Sams, PA-C 02/11/20 1757

## 2020-02-11 NOTE — ED Triage Notes (Signed)
PT C/O: cold sx onset this morning associated w/fever, headache, chills, body aches, fatigue  DENIES: v/n/d  A&O x4... NAD... Ambulatory

## 2020-02-11 NOTE — Discharge Instructions (Addendum)
-  Tylenol/ibuprofen for fevers,chills, and body aches -Promethazine DM cough syrup for congestion and cough -Make sure to rest and drink plenty of fluids.

## 2020-02-14 ENCOUNTER — Encounter: Payer: Self-pay | Admitting: General Practice

## 2020-04-24 ENCOUNTER — Encounter (HOSPITAL_COMMUNITY): Payer: Self-pay

## 2020-04-24 ENCOUNTER — Other Ambulatory Visit: Payer: Self-pay

## 2020-04-24 ENCOUNTER — Ambulatory Visit (INDEPENDENT_AMBULATORY_CARE_PROVIDER_SITE_OTHER): Payer: 59

## 2020-04-24 ENCOUNTER — Ambulatory Visit (HOSPITAL_COMMUNITY)
Admission: EM | Admit: 2020-04-24 | Discharge: 2020-04-24 | Disposition: A | Discharge status: Home / Self Care | Payer: 59 | Attending: Family Medicine | Admitting: Family Medicine

## 2020-04-24 DIAGNOSIS — M25471 Effusion, right ankle: Secondary | ICD-10-CM | POA: Diagnosis not present

## 2020-04-24 DIAGNOSIS — M25571 Pain in right ankle and joints of right foot: Secondary | ICD-10-CM

## 2020-04-24 MED ORDER — NAPROXEN 500 MG PO TABS: 500.0000 mg | ORAL_TABLET | Freq: Two times a day (BID) | ORAL | 0 refills | Status: DC | PRN

## 2020-04-24 NOTE — ED Provider Notes (Signed)
Kelly Willis    CSN: [id number] Arrival date & time: 04/24/20  0844      History   Chief Complaint Chief Complaint  Patient presents with  . Ankle Pain    HPI Kelly Willis is a 25 y.o. female.   Patient here today with 2-day history of right medial ankle pain and swelling without any known injury.  She states she stands on her feet all day at work and thinks this might be contributing to it.  Has had issues with this ankle at times in the past and states at one point she was on crutches for it.  Denies discoloration, rashes, wounds, new shoes or activities.  Has not been trying anything over-the-counter for symptoms at this time.     Past Medical History:  Diagnosis Date  . ADHD (attention deficit hyperactivity disorder)   . Gestational diabetes   . Obesity   . Pilonidal abscess 10/16/2012  . Pilonidal cyst 06/2014    Patient Active Problem List   Diagnosis Date Noted  . History of bilateral tubal ligation 12/10/2019  . Vaginal delivery 12/08/2019  . Alpha thalassemia silent carrier 10/06/2019  . Anemia in pregnancy 09/20/2019  . Chronic hypertension affecting pregnancy 09/17/2019  . BMI 50.0-59.9, adult ([place]) 09/17/2019  . Supervision of high risk pregnancy, antepartum 07/07/2019  . Obesity in pregnancy   . History of gestational diabetes mellitus 10/20/2017  . Lesion of skin of breast 10/20/2017  . Limited prenatal care, antepartum 10/20/2017  . Weight loss 10/20/2017  . 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 NO PAST SURGERIES    . PILONIDAL CYST EXCISION    . PILONIDAL CYST EXCISION N/A 07/08/2014   Procedure: CYST EXCISION PILONIDAL ;  Surgeon: Jackolyn Confer, MD;  Location: [place];  Service: General;  Laterality: N/A;  . TUBAL LIGATION N/A 12/10/2019   Procedure: POST PARTUM TUBAL LIGATION;  Surgeon: Truett Mainland, DO;  [facility];  Service: Gynecology;  Laterality: N/A;    OB History    Gravida  4   Para  3   Term  3   Preterm  0   AB  1   Living  3     SAB  0   IAB  0   Ectopic  0   Multiple  0   Live Births  3           Home Medications    Prior to Admission medications   Medication Sig Start Date End Date Taking? Authorizing Provider  naproxen (NAPROSYN) 500 MG tablet Take 1 tablet (500 mg total) by mouth 2 (two) times daily as needed. 04/24/20  Yes Volney American, PA-C  acetaminophen (TYLENOL) 500 MG tablet Take 2 tablets (1,000 mg total) by mouth every 8 (eight) hours. 12/10/19   Myrtis Ser, CNM  amLODipine (NORVASC) 5 MG tablet Take 1 tablet (5 mg total) by mouth daily. 12/10/19   Myrtis Ser, CNM  Blood Pressure Monitoring (BLOOD PRESSURE KIT) DEVI 1 Device by Does not apply route as needed. 07/07/19   Luvenia Redden, PA-C  oxyCODONE (OXY IR/ROXICODONE) 5 MG immediate release tablet Take 1 tablet (5 mg total) by mouth every 4 (four) hours as needed for severe pain. 12/10/19   Myrtis Ser, CNM  promethazine-dextromethorphan (PROMETHAZINE-DM) 6.25-15 MG/5ML syrup Take 5 mLs by mouth 4 (four) times daily as needed for  cough. 02/11/20   Hazel Sams, PA-C    Family History Family History  Problem Relation Age of Onset  . Hypertension Maternal Grandfather     Social History Social History   Tobacco Use  . Smoking status: Former Smoker    Years: 2.00    Types: Cigarettes    Quit date: 04/15/2017    Years since quitting: 3.0  . Smokeless tobacco: Never Used  . Tobacco comment: 4 cig./day  Vaping Use  . Vaping Use: Never used  Substance Use Topics  . Alcohol use: Not Currently    Comment: occasionally  . Drug use: Yes    Types: Marijuana    Comment: daily 1.5 per day     Allergies   Pepto-bismol [bismuth subsalicylate]   Review of Systems Review of Systems Per HPI  Physical Exam Triage Vital Signs ED Triage Vitals  Enc Vitals Group      BP 04/24/20 0910 (!) 143/87     Pulse Rate 04/24/20 0910 83     Resp 04/24/20 0910 18     Temp 04/24/20 0910 98.6 F (37 C)     Temp Source 04/24/20 0910 Oral     SpO2 04/24/20 0910 98 %     Weight --      Height --      Head Circumference --      Peak Flow --      Pain Score 04/24/20 0908 9     Pain Loc --      Pain Edu? --      Excl. in [place]? --    No data found.  Updated Vital Signs BP (!) 143/87 (BP Location: Right Arm)   Pulse 83   Temp 98.6 F (37 C) (Oral)   Resp 18   LMP 03/28/2020   SpO2 98%   Visual Acuity Right Eye Distance:   Left Eye Distance:   Bilateral Distance:    Right Eye Near:   Left Eye Near:    Bilateral Near:     Physical Exam Vitals and nursing note reviewed.  Constitutional:      Appearance: Normal appearance. She is not ill-appearing.  HENT:     Head: Atraumatic.     Mouth/Throat:     Mouth: Mucous membranes are moist.  Eyes:     Extraocular Movements: Extraocular movements intact.     Conjunctiva/sclera: Conjunctivae normal.  Cardiovascular:     Rate and Rhythm: Normal rate and regular rhythm.     Heart sounds: Normal heart sounds.  Pulmonary:     Effort: Pulmonary effort is normal.     Breath sounds: Normal breath sounds.  Musculoskeletal:        General: Swelling, tenderness and signs of injury present. Normal range of motion.     Cervical back: Normal range of motion and neck supple.     Comments: Right ankle swelling, worst medially right medial malleolus Tender to palpation surrounding medial malleolus  Skin:    General: Skin is warm and dry.     Findings: No erythema or rash.  Neurological:     Mental Status: She is alert and oriented to person, place, and time.     Sensory: No sensory deficit.     Motor: No weakness.     Gait: Gait normal.  Psychiatric:        Mood and Affect: Mood normal.        Thought Content: Thought content normal.  Judgment: Judgment normal.     UC Treatments / Results   Labs (all labs ordered are listed, but only abnormal results are displayed) Labs Reviewed - No data to display  EKG   Radiology DG Ankle Complete Right  Result Date: 04/24/2020 CLINICAL DATA:  Right ankle pain and swelling no injury EXAM: RIGHT ANKLE - COMPLETE 3+ VIEW COMPARISON:  None. FINDINGS: Normal ankle joint. Small bone fragment adjacent to the distal fibula as well corticated appears chronic. Extensive soft tissue swelling medially. No medial fracture identified. IMPRESSION: Extensive medial soft tissue swelling without medial fracture Probable chronic injury distal fibula. Electronically Signed   By: Franchot Gallo M.D.   On: 04/24/2020 09:48    Procedures Procedures (including critical care time)  Medications Ordered in UC Medications - No data to display  Initial Impression / Assessment and Plan / UC Course  I have reviewed the triage vital signs and the nursing notes.  Pertinent labs & imaging results that were available during my care of the patient were reviewed by me and considered in my medical decision making (see chart for details).     X-ray right ankle show no bony abnormality.  Suspect overuse injury from long shifts on her feet.  Discussed compression sleeves, leg elevation, ice off-and-on, over-the-counter pain relievers.  Work note given for today.  Follow-up with primary care for recheck.  Final Clinical Impressions(s) / UC Diagnoses   Final diagnoses:  Acute right ankle pain   Discharge Instructions   None    ED Prescriptions    Medication Sig Dispense Auth. Provider   naproxen (NAPROSYN) 500 MG tablet Take 1 tablet (500 mg total) by mouth 2 (two) times daily as needed. 30 tablet Volney American, [place]     PDMP not reviewed this encounter.   Volney American, [place] 04/24/20 1013

## 2020-04-24 NOTE — ED Triage Notes (Signed)
Pt presents with right ankle pain & swelling X 2 weeks; pt states she is on her feet a lot throughout the day.

## 2020-05-31 ENCOUNTER — Emergency Department (HOSPITAL_COMMUNITY)
Admission: EM | Admit: 2020-05-31 | Discharge: 2020-05-31 | Disposition: A | Discharge status: Home / Self Care | Payer: Medicaid Other | Attending: Emergency Medicine | Admitting: Emergency Medicine

## 2020-05-31 ENCOUNTER — Encounter (HOSPITAL_COMMUNITY): Payer: Self-pay

## 2020-05-31 ENCOUNTER — Other Ambulatory Visit: Payer: Self-pay

## 2020-05-31 ENCOUNTER — Emergency Department (HOSPITAL_COMMUNITY): Payer: Medicaid Other

## 2020-05-31 DIAGNOSIS — Z79899 Other long term (current) drug therapy: Secondary | ICD-10-CM | POA: Insufficient documentation

## 2020-05-31 DIAGNOSIS — I10 Essential (primary) hypertension: Secondary | ICD-10-CM | POA: Insufficient documentation

## 2020-05-31 DIAGNOSIS — E119 Type 2 diabetes mellitus without complications: Secondary | ICD-10-CM | POA: Insufficient documentation

## 2020-05-31 DIAGNOSIS — Z87891 Personal history of nicotine dependence: Secondary | ICD-10-CM | POA: Insufficient documentation

## 2020-05-31 DIAGNOSIS — R112 Nausea with vomiting, unspecified: Secondary | ICD-10-CM | POA: Insufficient documentation

## 2020-05-31 DIAGNOSIS — R519 Headache, unspecified: Secondary | ICD-10-CM | POA: Diagnosis present

## 2020-05-31 LAB — LIPASE, BLOOD: Lipase: 45 U/L (ref 11–51)

## 2020-05-31 LAB — COMPREHENSIVE METABOLIC PANEL
ALT: 16 U/L (ref 0–44)
AST: 18 U/L (ref 15–41)
Albumin: 3.5 g/dL (ref 3.5–5.0)
Alkaline Phosphatase: 59 U/L (ref 38–126)
Anion gap: 7 (ref 5–15)
BUN: 7 mg/dL (ref 6–20)
CO2: 28 mmol/L (ref 22–32)
Calcium: 9.2 mg/dL (ref 8.9–10.3)
Chloride: 105 mmol/L (ref 98–111)
Creatinine, Ser: 0.77 mg/dL (ref 0.44–1.00)
GFR, Estimated: >60 mL/min (ref >60)
Glucose, Bld: 118 mg/dL — ABNORMAL HIGH (ref 70–99)
Potassium: 3.7 mmol/L (ref 3.5–5.1)
Sodium: 140 mmol/L (ref 135–145)
Total Bilirubin: 0.2 mg/dL — ABNORMAL LOW (ref 0.3–1.2)
Total Protein: 6.8 g/dL (ref 6.5–8.1)

## 2020-05-31 LAB — CBC
HCT: 39.5 % (ref 36.0–46.0)
Hemoglobin: 12 g/dL (ref 12.0–15.0)
MCH: 26.1 pg (ref 26.0–34.0)
MCHC: 30.4 g/dL (ref 30.0–36.0)
MCV: 86.1 fL (ref 80.0–100.0)
Platelets: 285 10*3/uL (ref 150–400)
RBC: 4.59 MIL/uL (ref 3.87–5.11)
RDW: 13.3 % (ref 11.5–15.5)
WBC: 8 10*3/uL (ref 4.0–10.5)
nRBC: 0 % (ref 0.0–0.2)

## 2020-05-31 LAB — I-STAT BETA HCG BLOOD, ED (MC, WL, AP ONLY): I-stat hCG, quantitative: <5 m[IU]/mL (ref <5)

## 2020-05-31 MED ORDER — PROCHLORPERAZINE EDISYLATE 10 MG/2ML IJ SOLN
10.0000 mg | Freq: Once | INTRAMUSCULAR | Status: AC
  Administered 2020-05-31: 10 mg via INTRAVENOUS
  Filled 2020-05-31: qty 2

## 2020-05-31 MED ORDER — DEXAMETHASONE SODIUM PHOSPHATE 10 MG/ML IJ SOLN
10.0000 mg | Freq: Once | INTRAMUSCULAR | Status: AC
  Administered 2020-05-31: 10 mg via INTRAVENOUS
  Filled 2020-05-31: qty 1

## 2020-05-31 MED ORDER — SODIUM CHLORIDE 0.9 % IV BOLUS
1000.0000 mL | Freq: Once | INTRAVENOUS | Status: AC
  Administered 2020-05-31: 1000 mL via INTRAVENOUS

## 2020-05-31 MED ORDER — ONDANSETRON 4 MG PO TBDP: 4.0000 mg | ORAL_TABLET | Freq: Three times a day (TID) | ORAL | 0 refills | Status: DC | PRN

## 2020-05-31 MED ORDER — DIPHENHYDRAMINE HCL 50 MG/ML IJ SOLN
12.5000 mg | Freq: Once | INTRAMUSCULAR | Status: AC
  Administered 2020-05-31: 12.5 mg via INTRAVENOUS
  Filled 2020-05-31: qty 1

## 2020-05-31 NOTE — ED Triage Notes (Signed)
Headache since yesterday, vomiting and light sensitivity today.

## 2020-05-31 NOTE — Discharge Instructions (Addendum)
Your labs are overall reassuring.  Your CT scan did not show any significant abnormalities.  Please take Tylenol and or ibuprofen per over-the-counter dosing to help with discomfort.  We have sent in a prescription for Zofran to take every 8 hours as needed for nausea and vomiting.  We have prescribed you new medication(s) today. Discuss the medications prescribed today with your pharmacist as they can have adverse effects and interactions with your other medicines including over the counter and prescribed medications. Seek medical evaluation if you start to experience new or abnormal symptoms after taking one of these medicines, seek care immediately if you start to experience difficulty breathing, feeling of your throat closing, facial swelling, or rash as these could be indications of a more serious allergic reaction  Your blood pressure was elevated in the Er, please be sure to have this rechecked by your primary care provider.    Please call your primary care office to schedule follow-up appointment for as soon as possible for a recheck of your symptoms.  We have also provided neurology's information should this become a recurrent problem.  Return to the ER for new or worsening symptoms including but not limited to new or worsening pain, sudden change in headache, change in your vision, numbness, weakness, speech abnormality, fever, neck stiffness, passing out, or any other concerns.

## 2020-05-31 NOTE — ED Notes (Signed)
Patient Alert and oriented to baseline. Stable and ambulatory to baseline. Patient verbalized understanding of the discharge instructions.  Patient belongings were taken by the patient.   

## 2020-05-31 NOTE — ED Provider Notes (Signed)
Patient is a 25 year old female who is care transferred to me at shift change from [facility].  Her HPI is below:  Kelly Willis is a 25 y.o. female with a hx of hypertension, diabetes mellitus & bilateral tubal ligation who presents to the ED with complaints of headache today. Patient reports gradual onset frontal headache with steady progression that began yesterday.  Pain is constant, feels like a throbbing discomfort, worse with bright lights and loud noises, no alleviating factors.  Tried taking Tylenol without much change.  She has not associated nausea with 2 episodes of emesis.  She denies history of similar headache.  Denies fever, chills, visual disturbance, numbness, weakness, syncope, or abdominal pain.  Physical Exam  BP (!) 160/115 (BP Location: Left Arm)   Pulse 83   Temp 98.5 F (36.9 C) (Oral)   Resp 20   Ht 5\' 6"  (1.676 m)   Wt (!) 140.6 kg   SpO2 100%   BMI 50.03 kg/m   Physical Exam Vitals and nursing note reviewed.  Constitutional:      General: She is not in acute distress.    Appearance: She is well-developed. She is not toxic-appearing.  HENT:     Head: Normocephalic and atraumatic.  Eyes:     General: Vision grossly intact. Gaze aligned appropriately.        Right eye: No discharge.        Left eye: No discharge.     Extraocular Movements: Extraocular movements intact.     Conjunctiva/sclera: Conjunctivae normal.     Comments: PERRL. No proptosis.   Cardiovascular:     Rate and Rhythm: Normal rate and regular rhythm.  Pulmonary:     Effort: Pulmonary effort is normal. No respiratory distress.     Breath sounds: Normal breath sounds. No wheezing, rhonchi or rales.  Abdominal:     General: There is no distension.     Palpations: Abdomen is soft.     Tenderness: There is no abdominal tenderness. There is no guarding.  Musculoskeletal:     Cervical back: Normal range of motion and neck supple. No rigidity.  Skin:    General: Skin is warm and dry.      Findings: No rash.  Neurological:     Comments: Alert. Clear speech. No facial droop. CNIII-XII grossly intact. Bilateral upper and lower extremities' sensation grossly intact. 5/5 symmetric strength with grip strength and with plantar and dorsi flexion bilaterally . Normal finger to nose bilaterally. Negative pronator drift. Gait is steady and intact.   Psychiatric:        Behavior: Behavior normal.  ED Course/Procedures     Procedures  MDM  Patient is a 25 year old female who is here transformational change from 30.  Please see her note below for additional details.  Patient reports a worsening frontal headache.  No history of similar headaches.  No history of migraines.  Neurological exam is benign.  Given the acute nature of her current headache a CT scan of her head was obtained which was negative.  Patient was given a migraine cocktail and reassessed on multiple occasions.  She notes significant relief of her HA.  She is ambulating without difficulty and was successfully PO challenged.  Patient given additional dose of Decadron to help prevent rebound symptoms.  I feel that she is stable for discharge and she is agreeable.  Her questions were answered and she was amicable at the time of discharge.      [employer],  PA-C 05/31/20 0848    Margarita Grizzle, MD 06/01/20 [phone]

## 2020-05-31 NOTE — ED Provider Notes (Signed)
[facility] EMERGENCY DEPARTMENT Provider Note   CSN: [id number] Arrival date & time: 05/31/20  0421     History Chief Complaint  Patient presents with  . Headache  . Emesis    Kelly Willis is a 25 y.o. female with a hx of hypertension, diabetes mellitus & bilateral tubal ligation who presents to the ED with complaints of headache today. Patient reports gradual onset frontal headache with steady progression that began yesterday.  Pain is constant, feels like a throbbing discomfort, worse with bright lights and loud noises, no alleviating factors.  Tried taking Tylenol without much change.  She has not associated nausea with 2 episodes of emesis.  She denies history of similar headache.  Denies fever, chills, visual disturbance, numbness, weakness, syncope, or abdominal pain.  HPI     Past Medical History:  Diagnosis Date  . ADHD (attention deficit hyperactivity disorder)   . Gestational diabetes   . Obesity   . Pilonidal abscess 10/16/2012  . Pilonidal cyst 06/2014    Patient Active Problem List   Diagnosis Date Noted  . History of bilateral tubal ligation 12/10/2019  . Vaginal delivery 12/08/2019  . Alpha thalassemia silent carrier 10/06/2019  . Anemia in pregnancy 09/20/2019  . Chronic hypertension affecting pregnancy 09/17/2019  . BMI 50.0-59.9, adult ([place]) 09/17/2019  . Supervision of high risk pregnancy, antepartum 07/07/2019  . Obesity in pregnancy   . History of gestational diabetes mellitus 10/20/2017  . Lesion of skin of breast 10/20/2017  . Limited prenatal care, antepartum 10/20/2017  . Weight loss 10/20/2017  . 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 NO PAST SURGERIES    . PILONIDAL CYST EXCISION    . PILONIDAL CYST EXCISION N/A 07/08/2014   Procedure: CYST EXCISION PILONIDAL ;  Surgeon: Jackolyn Confer, MD;  Location: [facility];   Service: General;  Laterality: N/A;  . TUBAL LIGATION N/A 12/10/2019   Procedure: POST PARTUM TUBAL LIGATION;  Surgeon: Truett Mainland, DO;  Location: [facility];  Service: Gynecology;  Laterality: N/A;     OB History    Gravida  4   Para  3   Term  3   Preterm  0   AB  1   Living  3     SAB  0   IAB  0   Ectopic  0   Multiple  0   Live Births  3           Family History  Problem Relation Age of Onset  . Hypertension Maternal Grandfather     Social History   Tobacco Use  . Smoking status: Former Smoker    Years: 2.00    Types: Cigarettes    Quit date: 04/15/2017    Years since quitting: 3.1  . Smokeless tobacco: Never Used  . Tobacco comment: 4 cig./day  Vaping Use  . Vaping Use: Never used  Substance Use Topics  . Alcohol use: Not Currently    Comment: occasionally  . Drug use: Yes    Types: Marijuana    Comment: daily 1.5 per day    Home Medications Prior to Admission medications   Medication Sig Start Date End Date Taking? Authorizing Provider  acetaminophen (TYLENOL) 500 MG tablet TAKE 2 TABLETS (1,000 MG TOTAL) BY MOUTH EVERY 8 (EIGHT) HOURS. 12/10/19 12/09/20  Serita Grammes D, CNM  amLODipine (NORVASC) 5 MG tablet TAKE 1 TABLET (  5 MG TOTAL) BY MOUTH DAILY. 12/10/19 12/09/20  Myrtis Ser, CNM  Blood Pressure Monitoring (BLOOD PRESSURE KIT) DEVI 1 Device by Does not apply route as needed. 07/07/19   Luvenia Redden, PA-C  naproxen (NAPROSYN) 500 MG tablet Take 1 tablet (500 mg total) by mouth 2 (two) times daily as needed. 04/24/20   Volney American, PA-C  oxyCODONE (OXY IR/ROXICODONE) 5 MG immediate release tablet TAKE 1 TABLET (5 MG TOTAL) BY MOUTH EVERY 4 (FOUR) HOURS AS NEEDED FOR SEVERE PAIN. 12/10/19 06/07/20  Serita Grammes D, CNM  promethazine-dextromethorphan (PROMETHAZINE-DM) 6.25-15 MG/5ML syrup Take 5 mLs by mouth 4 (four) times daily as needed for cough. 02/11/20   Hazel Sams, PA-C    Allergies    Pepto-bismol  [bismuth subsalicylate]  Review of Systems   Review of Systems  Constitutional: Negative for chills and fever.  Respiratory: Negative for shortness of breath.   Cardiovascular: Negative for chest pain.  Gastrointestinal: Positive for nausea and vomiting. Negative for abdominal pain, blood in stool, constipation and diarrhea.  Genitourinary: Negative for dysuria.  Neurological: Positive for headaches. Negative for seizures, syncope, facial asymmetry, speech difficulty, weakness and numbness.  All other systems reviewed and are negative.   Physical Exam Updated Vital Signs BP (!) 160/115 (BP Location: Left Arm)   Pulse 83   Temp 98.5 F (36.9 C) (Oral)   Resp 20   Ht 5' 6" (1.676 m)   Wt (!) 140.6 kg   SpO2 100%   BMI 50.03 kg/m   Physical Exam Vitals and nursing note reviewed.  Constitutional:      General: She is not in acute distress.    Appearance: She is well-developed. She is not toxic-appearing.  HENT:     Head: Normocephalic and atraumatic.  Eyes:     General: Vision grossly intact. Gaze aligned appropriately.        Right eye: No discharge.        Left eye: No discharge.     Extraocular Movements: Extraocular movements intact.     Conjunctiva/sclera: Conjunctivae normal.     Comments: PERRL. No proptosis.   Cardiovascular:     Rate and Rhythm: Normal rate and regular rhythm.  Pulmonary:     Effort: Pulmonary effort is normal. No respiratory distress.     Breath sounds: Normal breath sounds. No wheezing, rhonchi or rales.  Abdominal:     General: There is no distension.     Palpations: Abdomen is soft.     Tenderness: There is no abdominal tenderness. There is no guarding.  Musculoskeletal:     Cervical back: Normal range of motion and neck supple. No rigidity.  Skin:    General: Skin is warm and dry.     Findings: No rash.  Neurological:     Comments: Alert. Clear speech. No facial droop. CNIII-XII grossly intact. Bilateral upper and lower extremities'  sensation grossly intact. 5/5 symmetric strength with grip strength and with plantar and dorsi flexion bilaterally . Normal finger to nose bilaterally. Negative pronator drift. Gait is steady and intact.   Psychiatric:        Behavior: Behavior normal.     ED Results / Procedures / Treatments   Labs (all labs ordered are listed, but only abnormal results are displayed) Labs Reviewed  COMPREHENSIVE METABOLIC PANEL - Abnormal; Notable for the following components:      Result Value   Glucose, Bld 118 (*)    Total Bilirubin 0.2 (*)  All other components within normal limits  LIPASE, BLOOD  CBC  URINALYSIS, ROUTINE W REFLEX MICROSCOPIC  I-STAT BETA HCG BLOOD, ED ([facility], [facility], [facility] ONLY)    EKG None  Radiology No results found.  Procedures Procedures   Medications Ordered in ED Medications - No data to display  ED Course  I have reviewed the triage vital signs and the nursing notes.  Pertinent labs & imaging results that were available during my care of the patient were reviewed by me and considered in my medical decision making (see chart for details).    MDM Rules/Calculators/A&P                          Patient presents to the ED with complaints of headache. Nontoxic, BP elevated, hx of HTN. No focal neuro deficits. Afebrile, no nuchal rigidity. No visual disturbance.   Additional history obtained:  Additional history obtained from chart review & nursing note review.   Lab Tests:  I reviewed and interpreted labs, which included:  CBC, CMP, lipase, preg test: Unremarkable.   Imaging Studies ordered:  I ordered imaging studies which included CT head wo contrast as patient does not have hx of similar headaches- pending.   Migraine cocktail ordered for symptomatic management.   06:30: Patient care signed out to [facility] PA-C at change of shift pending imaging & re-assessment. If no significant abnormalities and patient feeling improved anticipate discharge home.    Portions of this note were generated with [employer]. Dictation errors may occur despite best attempts at proofreading.  Final Clinical Impression(s) / ED Diagnoses Final diagnoses:  Acute nonintractable headache, unspecified headache type    Rx / DC Orders ED Discharge Orders    None       Amaryllis Dyke, PA-C 05/31/20 9169    Merrily Pew, MD 05/31/20 [phone]

## 2020-05-31 NOTE — ED Notes (Signed)
This RN attempted to start IV - unsuccessful. On coming RN notified

## 2020-05-31 NOTE — ED Notes (Signed)
Pt given ice water.

## 2021-11-19 ENCOUNTER — Emergency Department (HOSPITAL_COMMUNITY)
Admission: EM | Admit: 2021-11-19 | Discharge: 2021-11-19 | Disposition: A | Discharge status: Home / Self Care | Payer: Medicaid Other | Attending: Emergency Medicine | Admitting: Emergency Medicine

## 2021-11-19 ENCOUNTER — Encounter (HOSPITAL_COMMUNITY): Payer: Self-pay | Admitting: Emergency Medicine

## 2021-11-19 DIAGNOSIS — K047 Periapical abscess without sinus: Secondary | ICD-10-CM | POA: Insufficient documentation

## 2021-11-19 DIAGNOSIS — K0889 Other specified disorders of teeth and supporting structures: Secondary | ICD-10-CM | POA: Diagnosis present

## 2021-11-19 DIAGNOSIS — K029 Dental caries, unspecified: Secondary | ICD-10-CM | POA: Diagnosis not present

## 2021-11-19 MED ORDER — LIDOCAINE VISCOUS HCL 2 % MT SOLN: 15.0000 mL | OROMUCOSAL | 0 refills | Status: DC | PRN

## 2021-11-19 MED ORDER — LIDOCAINE VISCOUS HCL 2 % MT SOLN
15.0000 mL | Freq: Once | OROMUCOSAL | Status: AC
  Administered 2021-11-19: 15 mL via OROMUCOSAL
  Filled 2021-11-19: qty 15

## 2021-11-19 MED ORDER — AMOXICILLIN-POT CLAVULANATE 875-125 MG PO TABS: 1.0000 | ORAL_TABLET | Freq: Two times a day (BID) | ORAL | 0 refills | Status: DC

## 2021-11-19 MED ORDER — ACETAMINOPHEN 500 MG PO TABS: 500.0000 mg | ORAL_TABLET | Freq: Four times a day (QID) | ORAL | 0 refills | Status: DC | PRN

## 2021-11-19 MED ORDER — AMOXICILLIN 500 MG PO CAPS
1000.0000 mg | ORAL_CAPSULE | Freq: Once | ORAL | Status: AC
  Administered 2021-11-19: 1000 mg via ORAL
  Filled 2021-11-19: qty 2

## 2021-11-19 MED ORDER — IBUPROFEN 800 MG PO TABS: 800.0000 mg | ORAL_TABLET | Freq: Three times a day (TID) | ORAL | 0 refills | Status: DC

## 2021-11-19 NOTE — ED Provider Notes (Signed)
[facility] DEPT Provider Note   CSN: [id number] Arrival date & time: 11/19/21  1413     History  Chief Complaint  Patient presents with   Dental Pain    Kelly Willis is a 26 y.o. female presenting with dental pain.  Reports its been going on for almost 2 years.  Localizes it to the left side of her mouth and reports she is supposed to see a dentist in December.  Has never had dental work done   Dental Pain      Home Medications Prior to Admission medications   Medication Sig Start Date End Date Taking? Authorizing Provider  amLODipine (NORVASC) 5 MG tablet TAKE 1 TABLET (5 MG TOTAL) BY MOUTH DAILY. Patient taking differently: Take 5 mg by mouth daily. 12/10/19 12/09/20  Myrtis Ser, CNM  Blood Pressure Monitoring (BLOOD PRESSURE KIT) DEVI 1 Device by Does not apply route as needed. 07/07/19   Luvenia Redden, PA-C  ibuprofen (ADVIL) 200 MG tablet Take 400 mg by mouth every 6 (six) hours as needed for headache or moderate pain.    [provider]  naproxen (NAPROSYN) 500 MG tablet Take 1 tablet (500 mg total) by mouth 2 (two) times daily as needed. Patient not taking: Reported on 05/31/2020 04/24/20   Volney American, PA-C  ondansetron [facility] ODT) 4 MG disintegrating tablet Take 1 tablet (4 mg total) by mouth every 8 (eight) hours as needed for nausea or vomiting. 05/31/20   Petrucelli, Samantha R, PA-C  promethazine-dextromethorphan (PROMETHAZINE-DM) 6.25-15 MG/5ML syrup Take 5 mLs by mouth 4 (four) times daily as needed for cough. Patient not taking: Reported on 05/31/2020 02/11/20   Hazel Sams, PA-C      Allergies    Pepto-bismol [bismuth subsalicylate]    Review of Systems   Review of Systems  Physical Exam Updated Vital Signs BP (!) 168/96 (BP Location: Right Arm)   Pulse 93   Temp 98.7 F (37.1 C) (Oral)   Resp 20   LMP 11/08/2021   SpO2 98%  Physical Exam Vitals and nursing note reviewed.   Constitutional:      Appearance: Normal appearance.  HENT:     Head: Normocephalic and atraumatic.     Mouth/Throat:     Dentition: Dental tenderness and dental caries present.     Tongue: No lesions.     Pharynx: Oropharynx is clear. Uvula midline.      Comments: Left last molar broken to the gumline.  Infected upper and lower molars on the left side.  No signs of abscess. Eyes:     General: No scleral icterus.    Conjunctiva/sclera: Conjunctivae normal.  Pulmonary:     Effort: Pulmonary effort is normal. No respiratory distress.  Skin:    Findings: No rash.  Neurological:     Mental Status: She is alert.  Psychiatric:        Mood and Affect: Mood normal.     ED Results / Procedures / Treatments   Labs (all labs ordered are listed, but only abnormal results are displayed) Labs Reviewed - No data to display  EKG None  Radiology No results found.  Procedures Procedures   Medications Ordered in ED Medications  amoxicillin (AMOXIL) capsule 1,000 mg (has no administration in time range)  lidocaine (XYLOCAINE) 2 % viscous mouth solution 15 mL (has no administration in time range)    ED Course/ Medical Decision Making/ A&P  Medical Decision Making  26 year old female presenting with dental pain.  Has been going on for nearly 2 years.  She is supposed to see a dentist in December but the pain is not tolerable.  Has tried ibuprofen, Tylenol and topical Gabriel Earing but continues to have pain.  No systemic symptoms  Physical exam with multiple infected dental caries.  No signs of abscess.  Airway clear, tolerating secretions.    Treatment: Given lidocaine solution and first dose of antibiotics.  Patient will be discharged home with the remainder of an antibiotic course and dental resources to see if anybody can get her in sooner.  She is agreeable to this plan.   Final Clinical Impression(s) / ED Diagnoses Final diagnoses:  Pain due to dental  caries  Dental infection    Rx / DC Orders ED Discharge Orders          Ordered    amoxicillin-clavulanate (AUGMENTIN) 875-125 MG tablet  Every 12 hours        11/19/21 1445    lidocaine (XYLOCAINE) 2 % solution  As needed        11/19/21 1445    ibuprofen (ADVIL) 800 MG tablet  3 times daily        11/19/21 1445    acetaminophen (TYLENOL) 500 MG tablet  Every 6 hours PRN        11/19/21 1445           Results and diagnoses were explained to the patient. Return precautions discussed in full. Patient had no additional questions and expressed complete understanding.   This chart was dictated using voice recognition software.  Despite best efforts to proofread,  errors can occur which can change the documentation meaning.    Rhae Hammock, PA-C 11/19/21 1449    Sherwood Gambler, MD 11/22/21 [phone]

## 2021-11-19 NOTE — ED Triage Notes (Signed)
Patient c/o L upper dental pain x4 days. States scheduled to see dentist in December.

## 2021-11-19 NOTE — Discharge Instructions (Addendum)
Please start the antibiotic at your pharmacy.  You have been given your first dose today so you may take the first pill tomorrow morning.  It may upset your stomach so take it with food.  Return with any fevers, chills or worsening symptoms.  Ultimately you will need to see the dentist.  I have attached a few other people that may be able to see you sooner.

## 2022-02-08 IMAGING — US US MFM OB FOLLOW-UP
1 series · 14 of 28 positions shown · non-contrast
Comparison: none

[Series 1: us mfm ob follow-up · 36 acquisitions, 14 frames shown]
[im 2/36]
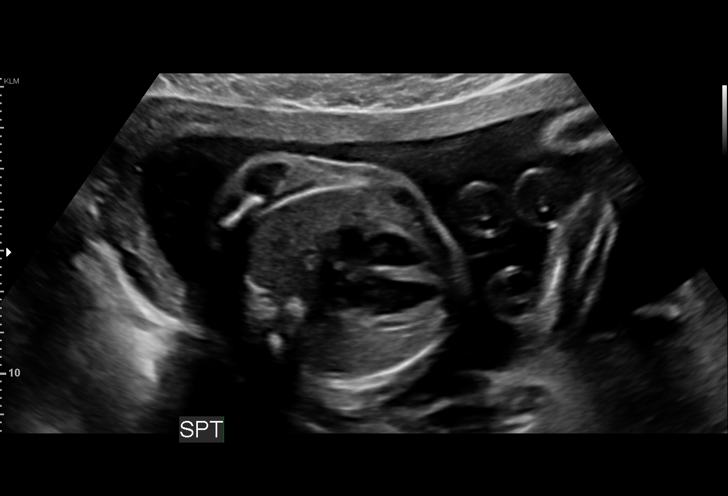
[im 4/36]
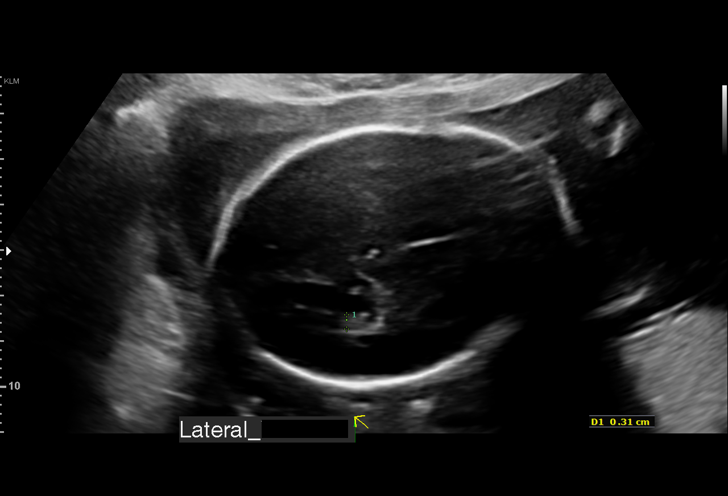
[im 7/36]
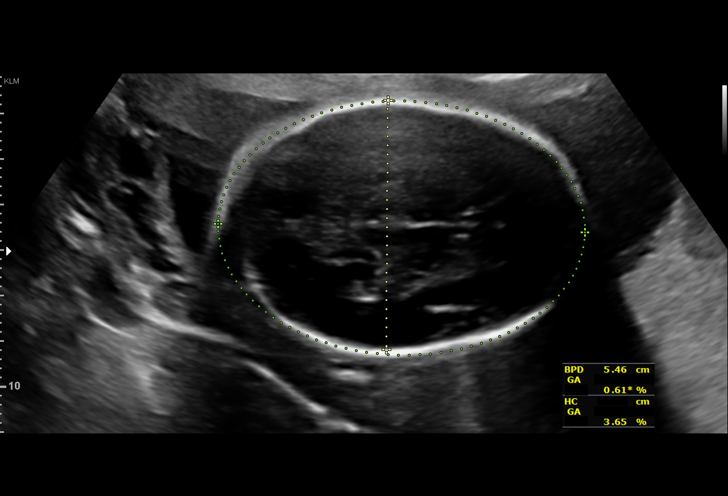
[im 10/36]
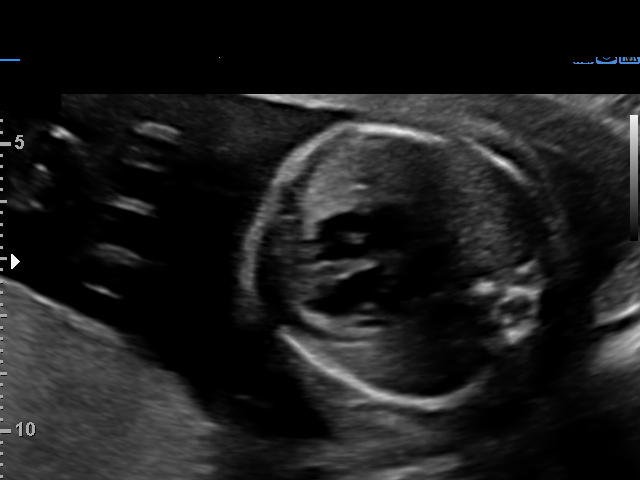
[im 12/36]
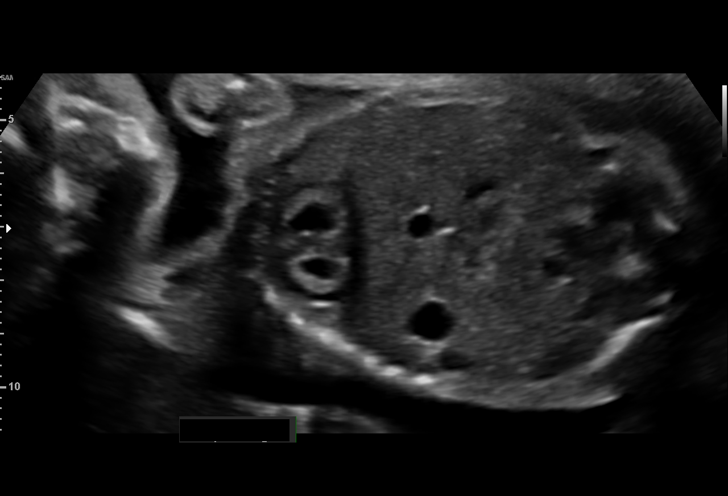
[im 15/36]
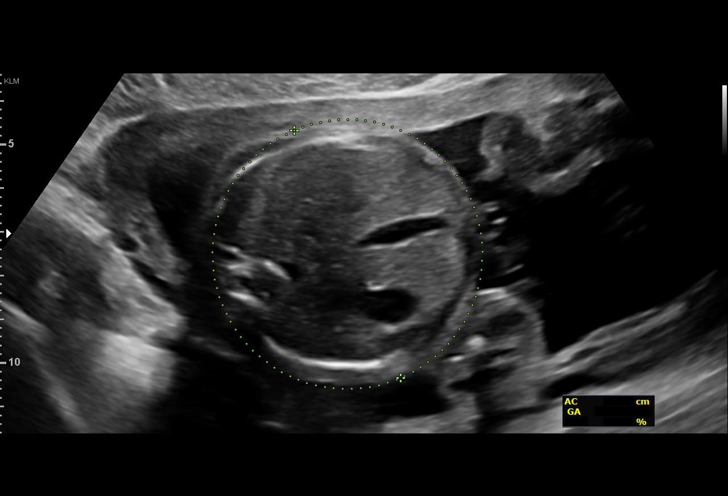
[im 17/36]
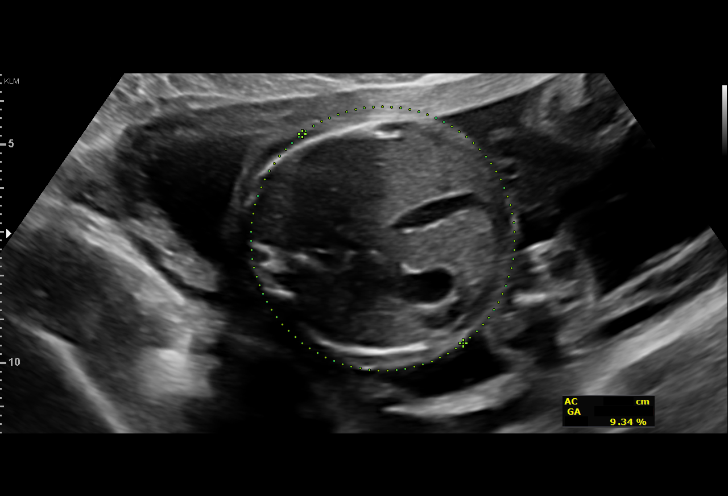
[im 20/36]
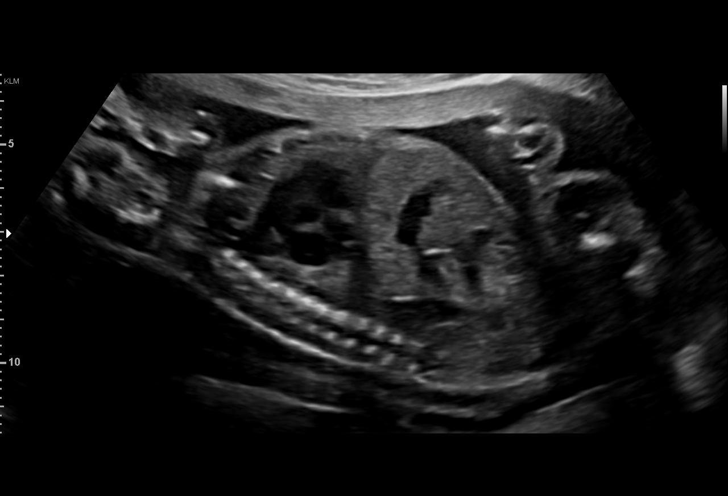
[im 23/36]
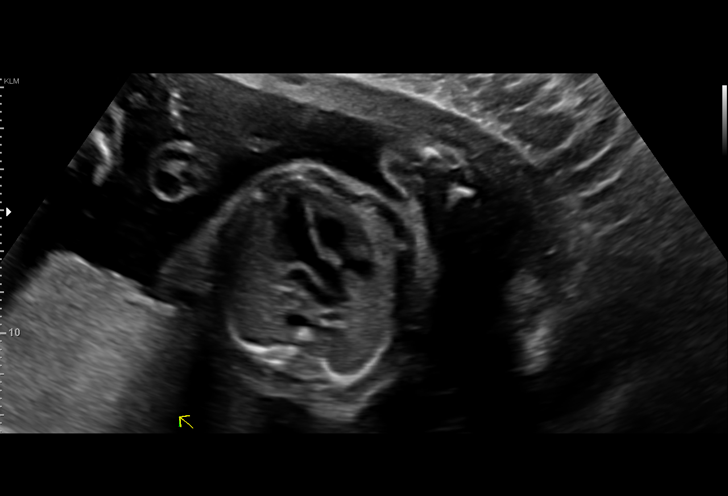
[im 25/36]
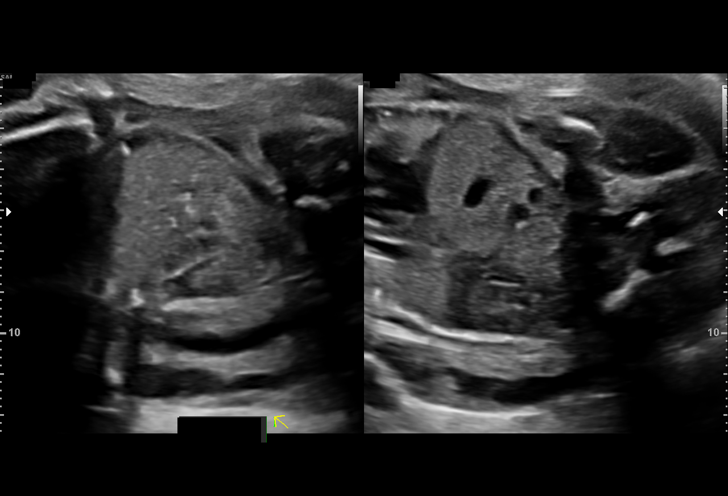
[im 28/36]
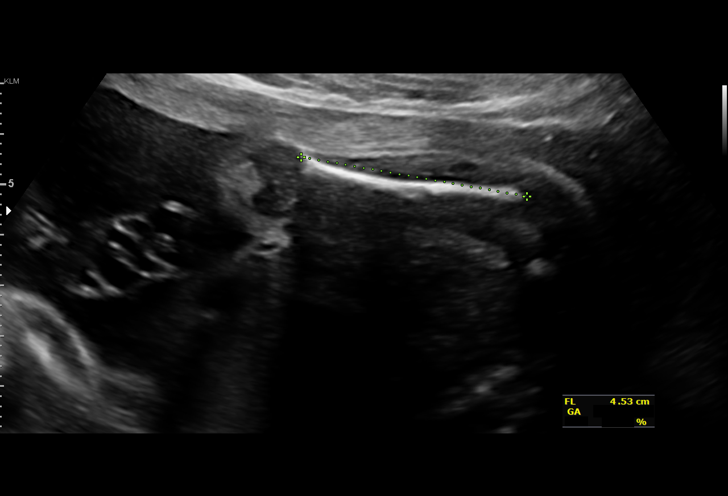
[im 30/36]
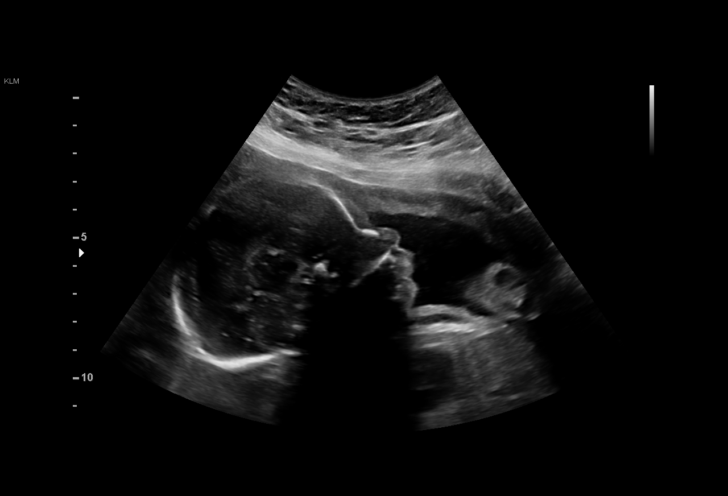
[im 33/36]
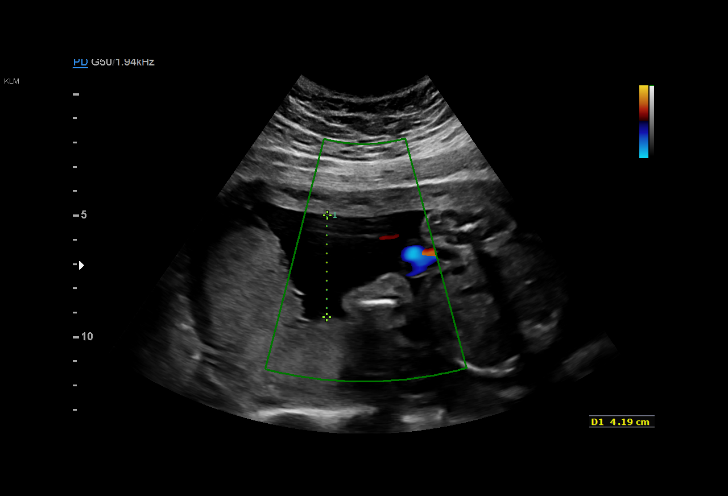
[im 36/36]
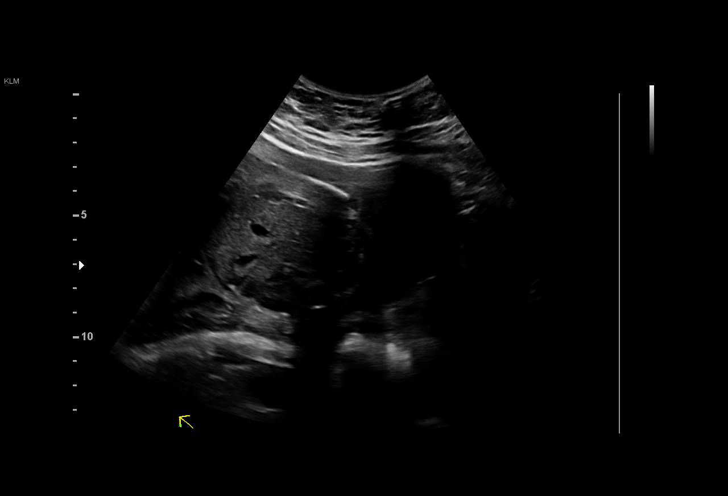

[14 of 28 positions shown; findings below may reference images not displayed]

Indications

 Obesity complicating pregnancy, second
 trimester
 25 weeks gestation of pregnancy
 Poor obstetric history: Previous gestational
 diabetes
 Isoimmunization - Other anti-M
 Antenatal follow-up for nonvisualized fetal
 anatomy
Fetal Evaluation

 Num Of Fetuses:          1
 Fetal Heart Rate(bpm):   140
 Cardiac Activity:        Observed
 Presentation:            Breech
 Placenta:                Posterior
 P. Cord Insertion:       Previously Visualized

 Amniotic Fluid
 AFI FV:      Within normal limits

                             Largest Pocket(cm)

Biometry

 BPD:      57.1  mm     G. Age:  23w 3d        4.5  %    CI:        70.34   %    70 - 86
                                                         FL/HC:       20.5  %    18.7 -
 HC:      217.1  mm     G. Age:  23w 5d        3.3  %    HC/AC:       1.12       1.04 -
 AC:      193.6  mm     G. Age:  24w 0d         16  %    FL/BPD:      77.8  %    71 - 87
 FL:       44.4  mm     G. Age:  24w 4d         25  %    FL/AC:       22.9  %    20 - 24

 Est. FW:     672   gm     1 lb 8 oz     13  %
OB History

 Blood Type:   O+
 Gravidity:    3         Term:   2        Prem:   0        SAB:   0
 TOP:          0       Ectopic:  0        Living: 2
Gestational Age

 LMP:           25w 0d        Date:  03/08/19                 EDD:   12/13/19
 U/S Today:     24w 0d                                        EDD:   12/20/19
 Best:          25w 0d     Det. By:  LMP  (03/08/19)          EDD:   12/13/19
Anatomy

 Cranium:               Appears normal         Aortic Arch:            Appears normal
 Cavum:                 Appears normal         Ductal Arch:            Appears normal
 Ventricles:            Appears normal         Diaphragm:              Appears normal
 Choroid Plexus:        Previously seen        Stomach:                Appears normal, left
                                                                       sided
 Cerebellum:            Previously seen        Abdomen:                Appears normal
 Posterior Fossa:       Previously seen        Abdominal Wall:         Previously seen
 Nuchal Fold:           Previously seen        Cord Vessels:           Previously seen
 Face:                  Orbits and profile     Kidneys:                Appear normal
                        previously seen
 Lips:                  Previously seen        Bladder:                Appears normal
 Thoracic:              Appears normal         Spine:                  Previously seen
 Heart:                 Appears normal         Upper Extremities:      Previously seen
                        (4CH, axis, and
                        situs)
 RVOT:                  Appears normal         Lower Extremities:      Previously seen
 LVOT:                  Appears normal

 Other:  Fetus appears to be female. Nasal bone previously visualized. Heels
         appear normal previously. Technically difficult due to maternal habitus
         and fetal position.
Cervix Uterus Adnexa

 Cervix
 Not visualized (advanced GA >39wks)
Impression

 Single intrauterine pregnancy here for follow up growth and
 anatomy
 Normal anatomy with measurements consistent with dates,
 however, growth parameters are SGA.
 There is good fetal movement and amniotic fluid volume
Recommendations

 Follow up growth in 4 weeks.

## 2022-03-29 ENCOUNTER — Ambulatory Visit: Payer: Medicaid Other | Admitting: Nurse Practitioner

## 2022-09-12 ENCOUNTER — Encounter (HOSPITAL_COMMUNITY): Payer: Self-pay

## 2022-09-12 ENCOUNTER — Ambulatory Visit (HOSPITAL_COMMUNITY)
Admission: RE | Admit: 2022-09-12 | Discharge: 2022-09-12 | Disposition: A | Discharge status: Home / Self Care | Payer: Medicaid Other | Source: Ambulatory Visit | Attending: Family Medicine | Admitting: Family Medicine

## 2022-09-12 VITALS — BP 138/88 | HR 89 | Temp 98.2°F | Resp 18 | Ht 64.0 in

## 2022-09-12 DIAGNOSIS — N898 Other specified noninflammatory disorders of vagina: Secondary | ICD-10-CM | POA: Diagnosis present

## 2022-09-12 DIAGNOSIS — Z7251 High risk heterosexual behavior: Secondary | ICD-10-CM | POA: Insufficient documentation

## 2022-09-12 DIAGNOSIS — Z711 Person with feared health complaint in whom no diagnosis is made: Secondary | ICD-10-CM | POA: Insufficient documentation

## 2022-09-12 LAB — POCT URINALYSIS DIP (MANUAL ENTRY)
Bilirubin, UA: NEGATIVE
Blood, UA: NEGATIVE
Glucose, UA: NEGATIVE mg/dL
Ketones, POC UA: NEGATIVE mg/dL
Nitrite, UA: NEGATIVE
Protein Ur, POC: NEGATIVE mg/dL
Spec Grav, UA: >=1.03 — AB (ref 1.010–1.025)
Urobilinogen, UA: 0.2 E.U./dL
pH, UA: 5.5 (ref 5.0–8.0)

## 2022-09-12 LAB — POCT URINE PREGNANCY: Preg Test, Ur: NEGATIVE

## 2022-09-12 MED ORDER — LIDOCAINE HCL (PF) 1 % IJ SOLN
INTRAMUSCULAR | Status: AC
  Filled 2022-09-12: qty 2

## 2022-09-12 MED ORDER — CEFTRIAXONE SODIUM 500 MG IJ SOLR
INTRAMUSCULAR | Status: AC
  Filled 2022-09-12: qty 500

## 2022-09-12 MED ORDER — CEFTRIAXONE SODIUM 500 MG IJ SOLR
500.0000 mg | Freq: Once | INTRAMUSCULAR | Status: AC
  Administered 2022-09-12: 500 mg via INTRAMUSCULAR

## 2022-09-12 MED ORDER — DOXYCYCLINE HYCLATE 100 MG PO CAPS: 100.0000 mg | ORAL_CAPSULE | Freq: Two times a day (BID) | ORAL | 0 refills | Status: DC

## 2022-09-12 NOTE — Discharge Instructions (Addendum)

## 2022-09-12 NOTE — ED Provider Notes (Signed)
[facility]   [id number] 09/12/22 Arrival Time: 1903  ASSESSMENT & PLAN:  1. Vaginal discharge   2. Concern about STD in female without diagnosis   3. High risk heterosexual behavior       Discharge Instructions      You have been given the following today for treatment of suspected gonorrhea and/or chlamydia:  cefTRIAXone (ROCEPHIN) injection 500 mg  Please pick up your prescription for doxycycline 100 mg and begin taking twice daily for the next seven (7) days.  Even though we have treated you today, we have sent testing for sexually transmitted infections. We will notify you of any positive results once they are received. If required, we will prescribe any medications you might need.  Please refrain from all sexual activity for at least the next seven days.     Without s/s of PID.  Labs Reviewed  POCT URINALYSIS DIP (MANUAL ENTRY) - Abnormal; Notable for the following components:      Result Value   Clarity, UA cloudy (*)    Spec Grav, UA >=1.030 (*)    Leukocytes, UA Small (1+) (*)    All other components within normal limits  POCT URINE PREGNANCY  CERVICOVAGINAL ANCILLARY ONLY   Will notify of any positive results. Instructed to refrain from sexual activity for at least seven days.  Reviewed expectations re: course of current medical issues. Questions answered. Outlined signs and symptoms indicating need for more acute intervention. Patient verbalized understanding. After Visit Summary given.   SUBJECTIVE:  Kelly Willis is a 27 y.o. female who presents with complaint of vaginal discharge. Noted 3 days ago. Two female sexual partners. Worried about STD. Also "small bump" on L thigh. Denies bleeding/draiange. Denies abd pain and fever. No tx PTA.   Patient's last menstrual period was 09/02/2022 (exact date).   OBJECTIVE:  Vitals:   09/12/22 1943 09/12/22 1944  BP:  138/88  Pulse:  89  Resp:  18  Temp:  98.2 F (36.8 C)  TempSrc:  Oral   SpO2:  98%  Height: 5\' 4"  (1.626 m)     General appearance: alert, cooperative, appears stated age and no distress Abdomen: obese, soft, non-tender GU: external vaginal exam is normal Cytogeneticist in room as chaperone); small inflamed hair follicle of L anterior thigh; no abscess Skin: warm and dry Psychological: alert and cooperative; normal mood and affect.  Results for orders placed or performed during the hospital encounter of 09/12/22  POCT urine pregnancy  Result Value Ref Range   Preg Test, Ur Negative Negative  POCT urinalysis dipstick  Result Value Ref Range   Color, UA yellow yellow   Clarity, UA cloudy (A) clear   Glucose, UA negative negative mg/dL   Bilirubin, UA negative negative   Ketones, POC UA negative negative mg/dL   Spec Grav, UA >=1.324 (A) 1.010 - 1.025   Blood, UA negative negative   pH, UA 5.5 5.0 - 8.0   Protein Ur, POC negative negative mg/dL   Urobilinogen, UA 0.2 0.2 or 1.0 E.U./dL   Nitrite, UA Negative Negative   Leukocytes, UA Small (1+) (A) Negative    Labs Reviewed  POCT URINALYSIS DIP (MANUAL ENTRY) - Abnormal; Notable for the following components:      Result Value   Clarity, UA cloudy (*)    Spec Grav, UA >=1.030 (*)    Leukocytes, UA Small (1+) (*)    All other components within normal limits  POCT URINE PREGNANCY  CERVICOVAGINAL ANCILLARY  ONLY    Allergies  Allergen Reactions   Pepto-Bismol [Bismuth Subsalicylate] Nausea And Vomiting    Past Medical History:  Diagnosis Date   ADHD (attention deficit hyperactivity disorder)    Gestational diabetes    Obesity    Pilonidal abscess 10/16/2012   Pilonidal cyst 06/2014   Family History  Problem Relation Age of Onset   Hypertension Maternal Grandfather    Social History   Socioeconomic History   Marital status: Single    Spouse name: Not on file   Number of children: 2   Years of education: Not on file   Highest education level: Not on file  Occupational History   Not on  file  Tobacco Use   Smoking status: Every Day    Current packs/day: 0.00    Types: Cigarettes    Start date: 04/16/2015    Last attempt to quit: 04/15/2017    Years since quitting: 5.4   Smokeless tobacco: Never   Tobacco comments:    4 cig./day  Vaping Use   Vaping status: Never Used  Substance and Sexual Activity   Alcohol use: Not Currently    Comment: occasionally   Drug use: Not Currently    Types: Marijuana    Comment: daily 1.5 per day   Sexual activity: Yes    Partners: Male    Birth control/protection: None  Other Topics Concern   Not on file  Social History Narrative   Not on file   Social Determinants of Health   Financial Resource Strain: Not on file  Food Insecurity: Food Insecurity Present (07/16/2019)   Hunger Vital Sign    Worried About Running Out of Food in the Last Year: Sometimes true    Ran Out of Food in the Last Year: Often true  Transportation Needs: Unmet Transportation Needs (07/16/2019)   PRAPARE - Administrator, Civil Service (Medical): No    Lack of Transportation (Non-Medical): Yes  Physical Activity: Not on file  Stress: Not on file  Social Connections: Not on file  Intimate Partner Violence: Not on file           [place]en [place], MD 09/12/22 2029

## 2022-09-12 NOTE — ED Triage Notes (Signed)
Vaginal Bleeding and itching onset 3 days ago. White discharge but no vaginal odor. Noticed a bump in that area as well. No urinary symptoms.   Std testing , no known exposure.

## 2022-09-13 ENCOUNTER — Telehealth: Payer: Self-pay

## 2022-09-13 MED ORDER — METRONIDAZOLE 500 MG PO TABS: 500.0000 mg | ORAL_TABLET | Freq: Two times a day (BID) | ORAL | 0 refills | Status: AC

## 2022-09-13 NOTE — Telephone Encounter (Signed)
Contacted patient by phone.  Verified identity using two identifiers.  Provided positive result.  Reviewed safe sex practices, notifying partners, and refraining from sexual activities for 7 days from time of treatment.  Patient verified understanding, all questions answered.     Per protocol, pt requires tx with metronidazole. Reviewed with patient, verified pharmacy, prescription sent.

## 2022-11-25 ENCOUNTER — Ambulatory Visit (HOSPITAL_COMMUNITY): Payer: Self-pay

## 2022-12-24 ENCOUNTER — Ambulatory Visit (HOSPITAL_COMMUNITY): Payer: Self-pay

## 2023-01-15 ENCOUNTER — Ambulatory Visit (HOSPITAL_COMMUNITY): Payer: Self-pay

## 2023-01-18 ENCOUNTER — Ambulatory Visit (HOSPITAL_COMMUNITY)
Admission: RE | Admit: 2023-01-18 | Discharge: 2023-01-18 | Disposition: A | Discharge status: Home / Self Care | Payer: Medicaid Other | Source: Ambulatory Visit | Attending: Emergency Medicine

## 2023-01-18 ENCOUNTER — Encounter (HOSPITAL_COMMUNITY): Payer: Self-pay

## 2023-01-18 VITALS — BP 151/92 | HR 108 | Temp 98.0°F | Resp 16

## 2023-01-18 DIAGNOSIS — N760 Acute vaginitis: Secondary | ICD-10-CM | POA: Diagnosis present

## 2023-01-18 DIAGNOSIS — Z113 Encounter for screening for infections with a predominantly sexual mode of transmission: Secondary | ICD-10-CM | POA: Diagnosis present

## 2023-01-18 LAB — HIV ANTIBODY (ROUTINE TESTING W REFLEX): HIV Screen 4th Generation wRfx: NONREACTIVE

## 2023-01-18 MED ORDER — FLUCONAZOLE 150 MG PO TABS: 150.0000 mg | ORAL_TABLET | Freq: Every day | ORAL | 0 refills | Status: DC

## 2023-01-18 NOTE — ED Provider Notes (Signed)
[facility]    CSN: [id number] Arrival date & time: 01/18/23  1152      History   Chief Complaint Chief Complaint  Patient presents with   SEXUALLY TRANSMITTED DISEASE    HPI Kelly Willis is a 27 y.o. female.   Patient presents to clinic complaining of vaginal itching with a increase in white vaginal discharge over the past few days.  She has had a recent new sexual partner and would like to be tested for all sexually transmitted infections.  Thinks maybe she has some yeast.  No dysuria.  No fevers.  No flank pain.  No abdominal pain, nausea or vomiting.  The history is provided by the patient and medical records.    Past Medical History:  Diagnosis Date   ADHD (attention deficit hyperactivity disorder)    Gestational diabetes    Obesity    Pilonidal abscess 10/16/2012   Pilonidal cyst 06/2014    Patient Active Problem List   Diagnosis Date Noted   History of bilateral tubal ligation 12/10/2019   Vaginal delivery 12/08/2019   Alpha thalassemia silent carrier 10/06/2019   Anemia in pregnancy 09/20/2019   Chronic hypertension affecting pregnancy 09/17/2019   BMI 50.0-59.9, adult (HCC) 09/17/2019   Supervision of high risk pregnancy, antepartum 07/07/2019   Obesity in pregnancy    History of gestational diabetes mellitus 10/20/2017   Lesion of skin of breast 10/20/2017   Limited prenatal care, antepartum 10/20/2017   Weight loss 10/20/2017   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    Past Surgical History:  Procedure Laterality Date   NO PAST SURGERIES     PILONIDAL CYST EXCISION     PILONIDAL CYST EXCISION N/A 07/08/2014   Procedure: CYST EXCISION PILONIDAL ;  Surgeon: [name], MD;  Location: [facility];  Service: General;  Laterality: N/A;   TUBAL LIGATION N/A 12/10/2019   Procedure: POST PARTUM TUBAL LIGATION;  Surgeon: Levie Heritage, DO;  Location: [facility];  Service: Gynecology;   Laterality: N/A;    OB History     Gravida  4   Para  3   Term  3   Preterm  0   AB  1   Living  3      SAB  0   IAB  0   Ectopic  0   Multiple  0   Live Births  3            Home Medications    Prior to Admission medications   Medication Sig Start Date End Date Taking? Authorizing Provider  fluconazole (DIFLUCAN) 150 MG tablet Take 1 tablet (150 mg total) by mouth daily. 01/18/23  Yes Rinaldo Ratel, [place] N, FNP  amLODipine (NORVASC) 5 MG tablet TAKE 1 TABLET (5 MG TOTAL) BY MOUTH DAILY. Patient not taking: Reported on 01/18/2023 12/10/19 12/09/20  Arabella Merles, CNM    Family History Family History  Problem Relation Age of Onset   Hypertension Maternal Grandfather     Social History Social History   Tobacco Use   Smoking status: Every Day    Current packs/day: 0.00    Types: Cigarettes    Start date: 04/16/2015    Last attempt to quit: 04/15/2017    Years since quitting: 5.7   Smokeless tobacco: Never   Tobacco comments:    4 cig./day  Vaping Use   Vaping status: Never Used  Substance Use Topics   Alcohol use: Not Currently  Comment: occasionally   Drug use: Not Currently    Types: Marijuana    Comment: daily 1.5 per day     Allergies   Pepto-bismol [bismuth subsalicylate]   Review of Systems Review of Systems  Per HPI   Physical Exam Triage Vital Signs ED Triage Vitals [01/18/23 1207]  Encounter Vitals Group     BP (!) 151/92     Systolic BP Percentile      Diastolic BP Percentile      Pulse Rate (!) 108     Resp 16     Temp 98 F (36.7 C)     Temp Source Oral     SpO2 96 %     Weight      Height      Head Circumference      Peak Flow      Pain Score 0     Pain Loc      Pain Education      Exclude from Growth Chart    No data found.  Updated Vital Signs BP (!) 151/92 (BP Location: Left Arm)   Pulse (!) 108   Temp 98 F (36.7 C) (Oral)   Resp 16   LMP 01/06/2023 (Approximate)   SpO2 96%   Visual  Acuity Right Eye Distance:   Left Eye Distance:   Bilateral Distance:    Right Eye Near:   Left Eye Near:    Bilateral Near:     Physical Exam Vitals and nursing note reviewed.  Constitutional:      Appearance: Normal appearance.  HENT:     Head: Normocephalic and atraumatic.     Right Ear: External ear normal.     Left Ear: External ear normal.     Nose: Nose normal.     Mouth/Throat:     Mouth: Mucous membranes are moist.  Eyes:     Conjunctiva/sclera: Conjunctivae normal.  Cardiovascular:     Rate and Rhythm: Normal rate.  Pulmonary:     Effort: Pulmonary effort is normal. No respiratory distress.  Musculoskeletal:        General: Normal range of motion.  Neurological:     General: No focal deficit present.     Mental Status: She is alert.  Psychiatric:        Mood and Affect: Mood normal.      UC Treatments / Results  Labs (all labs ordered are listed, but only abnormal results are displayed) Labs Reviewed  RPR  HIV ANTIBODY (ROUTINE TESTING W REFLEX)  CERVICOVAGINAL ANCILLARY ONLY    EKG   Radiology No results found.  Procedures Procedures (including critical care time)  Medications Ordered in UC Medications - No data to display  Initial Impression / Assessment and Plan / UC Course  I have reviewed the triage vital signs and the nursing notes.  Pertinent labs & imaging results that were available during my care of the patient were reviewed by me and considered in my medical decision making (see chart for details).  Vitals and triage reviewed, patient is hemodynamically stable.  Cytology swab obtained.  Staff attempting lab work at this time for HIV and syphilis screening.  Will cover with Diflucan to do vaginal itching and increased white discharge with out odor.  Plan of care, follow-up care and return precautions given, no questions at this time.     Final Clinical Impressions(s) / UC Diagnoses   Final diagnoses:  Acute vaginitis   Screening examination for sexually transmitted disease  Discharge Instructions      Take the Diflucan to help with possible yeast vaginitis.  The results of the back over the next few days if your testing and we will contact you if we need to modify your treatment plan.  Abstain from intercourse until all results have been received.  Return to clinic for any new or urgent symptoms.     ED Prescriptions     Medication Sig Dispense Auth. Provider   fluconazole (DIFLUCAN) 150 MG tablet Take 1 tablet (150 mg total) by mouth daily. 1 tablet Ether Goebel, [place] N, FNP      PDMP not reviewed this encounter.   Korben Carcione, [place] N, [place] 01/18/23 1329

## 2023-01-18 NOTE — ED Triage Notes (Signed)
Patient here today with c/o vaginal itching and would like to be tested for all STDs.

## 2023-01-18 NOTE — Discharge Instructions (Addendum)
Take the Diflucan to help with possible yeast vaginitis.  The results of the back over the next few days if your testing and we will contact you if we need to modify your treatment plan.  Abstain from intercourse until all results have been received.  Return to clinic for any new or urgent symptoms.

## 2023-01-19 LAB — RPR: RPR Ser Ql: NONREACTIVE

## 2023-01-20 ENCOUNTER — Telehealth (HOSPITAL_COMMUNITY): Payer: Self-pay

## 2023-01-20 LAB — CERVICOVAGINAL ANCILLARY ONLY
Bacterial Vaginitis (gardnerella): POSITIVE — AB
Candida Glabrata: NEGATIVE
Candida Vaginitis: NEGATIVE
Chlamydia: NEGATIVE
Comment: NEGATIVE
Comment: NEGATIVE
Comment: NEGATIVE
Comment: NEGATIVE
Comment: NEGATIVE
Comment: NORMAL
Neisseria Gonorrhea: NEGATIVE
Trichomonas: NEGATIVE

## 2023-01-20 MED ORDER — METRONIDAZOLE 500 MG PO TABS: 500.0000 mg | ORAL_TABLET | Freq: Two times a day (BID) | ORAL | 0 refills | Status: DC

## 2023-01-20 NOTE — Telephone Encounter (Signed)
Per protocol, pt requires tx with metronidazole. Rx sent to pharmacy on file.

## 2023-02-25 ENCOUNTER — Encounter (HOSPITAL_COMMUNITY): Payer: Self-pay | Admitting: Emergency Medicine

## 2023-02-25 ENCOUNTER — Emergency Department (HOSPITAL_COMMUNITY)
Admission: EM | Admit: 2023-02-25 | Discharge: 2023-02-25 | Discharge status: Against Medical Advice | Payer: Medicaid Other | Attending: Student | Admitting: Student

## 2023-02-25 ENCOUNTER — Emergency Department (HOSPITAL_COMMUNITY): Payer: Medicaid Other

## 2023-02-25 ENCOUNTER — Ambulatory Visit (HOSPITAL_COMMUNITY)
Admission: EM | Admit: 2023-02-25 | Discharge: 2023-02-25 | Disposition: A | Discharge status: Home / Self Care | Payer: Medicaid Other | Attending: Family Medicine | Admitting: Family Medicine

## 2023-02-25 DIAGNOSIS — M79645 Pain in left finger(s): Secondary | ICD-10-CM | POA: Insufficient documentation

## 2023-02-25 DIAGNOSIS — L03012 Cellulitis of left finger: Secondary | ICD-10-CM | POA: Diagnosis not present

## 2023-02-25 DIAGNOSIS — Z5321 Procedure and treatment not carried out due to patient leaving prior to being seen by health care provider: Secondary | ICD-10-CM | POA: Diagnosis not present

## 2023-02-25 LAB — BASIC METABOLIC PANEL
Anion gap: 8 (ref 5–15)
BUN: 10 mg/dL (ref 6–20)
CO2: 24 mmol/L (ref 22–32)
Calcium: 8.7 mg/dL — ABNORMAL LOW (ref 8.9–10.3)
Chloride: 103 mmol/L (ref 98–111)
Creatinine, Ser: 0.81 mg/dL (ref 0.44–1.00)
GFR, Estimated: >60 mL/min (ref >60)
Glucose, Bld: 118 mg/dL — ABNORMAL HIGH (ref 70–99)
Potassium: 3.3 mmol/L — ABNORMAL LOW (ref 3.5–5.1)
Sodium: 135 mmol/L (ref 135–145)

## 2023-02-25 LAB — CBC WITH DIFFERENTIAL/PLATELET
Abs Immature Granulocytes: 0.01 10*3/uL (ref 0.00–0.07)
Basophils Absolute: 0 10*3/uL (ref 0.0–0.1)
Basophils Relative: 0 %
Eosinophils Absolute: 0.2 10*3/uL (ref 0.0–0.5)
Eosinophils Relative: 3 %
HCT: 38.8 % (ref 36.0–46.0)
Hemoglobin: 11.7 g/dL — ABNORMAL LOW (ref 12.0–15.0)
Immature Granulocytes: 0 %
Lymphocytes Relative: 38 %
Lymphs Abs: 2.8 10*3/uL (ref 0.7–4.0)
MCH: 25.9 pg — ABNORMAL LOW (ref 26.0–34.0)
MCHC: 30.2 g/dL (ref 30.0–36.0)
MCV: 85.8 fL (ref 80.0–100.0)
Monocytes Absolute: 0.5 10*3/uL (ref 0.1–1.0)
Monocytes Relative: 7 %
Neutro Abs: 3.7 10*3/uL (ref 1.7–7.7)
Neutrophils Relative %: 52 %
Platelets: 265 10*3/uL (ref 150–400)
RBC: 4.52 MIL/uL (ref 3.87–5.11)
RDW: 13.2 % (ref 11.5–15.5)
WBC: 7.3 10*3/uL (ref 4.0–10.5)
nRBC: 0 % (ref 0.0–0.2)

## 2023-02-25 LAB — POC URINE PREG, ED: Preg Test, Ur: NEGATIVE

## 2023-02-25 MED ORDER — AMOXICILLIN-POT CLAVULANATE 875-125 MG PO TABS: 1.0000 | ORAL_TABLET | Freq: Two times a day (BID) | ORAL | 0 refills | Status: AC

## 2023-02-25 MED ORDER — LIDOCAINE HCL (PF) 1 % IJ SOLN
INTRAMUSCULAR | Status: AC
Start: 2023-02-25 — End: ?
  Filled 2023-02-25: qty 2

## 2023-02-25 MED ORDER — CEFTRIAXONE SODIUM 1 G IJ SOLR
1.0000 g | Freq: Once | INTRAMUSCULAR | Status: AC
  Administered 2023-02-25: 1 g via INTRAMUSCULAR

## 2023-02-25 MED ORDER — CEFTRIAXONE SODIUM 1 G IJ SOLR
INTRAMUSCULAR | Status: AC
  Filled 2023-02-25: qty 10

## 2023-02-25 NOTE — ED Provider Triage Note (Signed)
 Emergency Medicine Provider Triage Evaluation Note  Kelly Willis , a 28 y.o. female  was evaluated in triage.  Pt complains of swelling and pain to the proximal portion of the left index finger.  Patient states that on Thanksgiving she has a/a in the left index finger and is not sure if she may have had a antiplatelet or something else at that point.  She states that everything is been going fine until Monday morning when it began to swell, she complains of pain with range of motion.     Review of Systems  Positive:  Negative:   Physical Exam  BP (!) 156/106   Pulse 95   Temp 98.4 F (36.9 C) (Oral)   Resp 17   LMP 02/05/2023   SpO2 100%  Gen:   Awake, no distress   Resp:  Normal effort  MSK:   Moves extremities without difficulty  Other:    Medical Decision Making  Medically screening exam initiated at 3:45 AM.  Appropriate orders placed.  Kelly Willis was informed that the remainder of the evaluation will be completed by another provider, this initial triage assessment does not replace that evaluation, and the importance of remaining in the ED until their evaluation is complete.     Kelly Willis, [place] 02/25/23 [phone]

## 2023-02-25 NOTE — ED Provider Notes (Signed)
 [facility]    CSN: [id number] Arrival date & time: 02/25/23  1023      History   Chief Complaint Chief Complaint  Patient presents with   Hand Pain    HPI Kelly Willis is a 28 y.o. female.    Hand Pain  Patient is here for left index finger pain, redness and swelling.  She noted a small hole at the finger.  She noted it 2 months ago, and did not bother her.  It started hurting to that area about 2 days ago, with swelling.  She tried to soak it with help.  Today she woke up and it is more swollen, painful and tender.  No fevers/chills.  She went to the ER earlier this morning, but left to come here. CBC normal, xray  normal.        Past Medical History:  Diagnosis Date   ADHD (attention deficit hyperactivity disorder)    Gestational diabetes    Obesity    Pilonidal abscess 10/16/2012   Pilonidal cyst 06/2014    Patient Active Problem List   Diagnosis Date Noted   History of bilateral tubal ligation 12/10/2019   Vaginal delivery 12/08/2019   Alpha thalassemia silent carrier 10/06/2019   Anemia in pregnancy 09/20/2019   Chronic hypertension affecting pregnancy 09/17/2019   BMI 50.0-59.9, adult (HCC) 09/17/2019   Supervision of high risk pregnancy, antepartum 07/07/2019   Obesity in pregnancy    History of gestational diabetes mellitus 10/20/2017   Lesion of skin of breast 10/20/2017   Limited prenatal care, antepartum 10/20/2017   Weight loss 10/20/2017   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    Past Surgical History:  Procedure Laterality Date   NO PAST SURGERIES     PILONIDAL CYST EXCISION     PILONIDAL CYST EXCISION N/A 07/08/2014   Procedure: CYST EXCISION PILONIDAL ;  Surgeon: Krystal Russell, MD;  Location: [facility];  Service: General;  Laterality: N/A;   TUBAL LIGATION N/A 12/10/2019   Procedure: POST PARTUM TUBAL LIGATION;  Surgeon: Barbra Lang PARAS, DO;  Location: [facility];   Service: Gynecology;  Laterality: N/A;    OB History     Gravida  4   Para  3   Term  3   Preterm  0   AB  1   Living  3      SAB  0   IAB  0   Ectopic  0   Multiple  0   Live Births  3            Home Medications    Prior to Admission medications   Medication Sig Start Date End Date Taking? Authorizing Provider  amLODipine  (NORVASC ) 5 MG tablet TAKE 1 TABLET (5 MG TOTAL) BY MOUTH DAILY. Patient not taking: Reported on 01/18/2023 12/10/19 12/09/20  Loreli Iha D, CNM  fluconazole  (DIFLUCAN ) 150 MG tablet Take 1 tablet (150 mg total) by mouth daily. 01/18/23   Dreama, [place]  N, FNP  metroNIDAZOLE  (FLAGYL ) 500 MG tablet Take 1 tablet (500 mg total) by mouth 2 (two) times daily. 01/20/23   LampteyAleene KIDD, MD    Family History Family History  Problem Relation Age of Onset   Hypertension Maternal Grandfather     Social History Social History   Tobacco Use   Smoking status: Every Day    Current packs/day: 0.00    Types: Cigarettes    Start date: 04/16/2015    Last  attempt to quit: 04/15/2017    Years since quitting: 5.8   Smokeless tobacco: Never   Tobacco comments:    4 cig./day  Vaping Use   Vaping status: Never Used  Substance Use Topics   Alcohol use: Not Currently    Comment: occasionally   Drug use: Not Currently    Types: Marijuana    Comment: daily 1.5 per day     Allergies   Pepto-bismol [bismuth subsalicylate]   Review of Systems Review of Systems  Constitutional: Negative.   HENT: Negative.    Respiratory: Negative.    Cardiovascular: Negative.   Gastrointestinal: Negative.   Genitourinary: Negative.   Skin:  Positive for color change and wound.     Physical Exam Triage Vital Signs ED Triage Vitals [02/25/23 1056]  Encounter Vitals Group     BP (!) 142/90     Systolic BP Percentile      Diastolic BP Percentile      Pulse Rate 79     Resp 16     Temp 98.6 F (37 C)     Temp Source Oral     SpO2 98 %      Weight      Height      Head Circumference      Peak Flow      Pain Score 10     Pain Loc      Pain Education      Exclude from Growth Chart    No data found.  Updated Vital Signs BP (!) 142/90 (BP Location: Right Arm)   Pulse 79   Temp 98.6 F (37 C) (Oral)   Resp 16   LMP 02/05/2023   SpO2 98%   Visual Acuity Right Eye Distance:   Left Eye Distance:   Bilateral Distance:    Right Eye Near:   Left Eye Near:    Bilateral Near:     Physical Exam Constitutional:      Appearance: Normal appearance.  Skin:    Comments: There is swelling, warmth and redness to the left index finger   this is most pronounced at the proximal phalange, but is through the finger to the distal palmar;  no redness/streaking to the palm or arm.  At the palmar aspect there is a small wound at the PIP flexor;  no drainage noted;   The finger is very tender;  limited rom due to pain and swelling  Neurological:     General: No focal deficit present.     Mental Status: She is alert.  Psychiatric:        Mood and Affect: Mood normal.      UC Treatments / Results  Labs (all labs ordered are listed, but only abnormal results are displayed) Labs Reviewed - No data to display  EKG   Radiology DG Finger Index Left Result Date: 02/25/2023 CLINICAL DATA:  28 year old female with history of swelling in the proximal aspect of the left index finger. Limited range of motion. EXAM: LEFT INDEX FINGER 2+V COMPARISON:  No priors. FINDINGS: Three views of the left second finger demonstrate no acute displaced fracture, subluxation or dislocation. Diffuse soft tissue swelling is noted, most evident around the proximal phalanx. IMPRESSION: 1. Diffuse soft tissue swelling around the left second proximal phalanx. No underlying osseous abnormality or retained radiopaque foreign body in the soft tissues. Electronically Signed   By: Toribio Aye M.D.   On: 02/25/2023 05:03    Procedures Procedures (including  critical  care time)  Medications Ordered in UC Medications  cefTRIAXone  (ROCEPHIN ) injection 1 g (has no administration in time range)    Initial Impression / Assessment and Plan / UC Course  I have reviewed the triage vital signs and the nursing notes.  Pertinent labs & imaging results that were available during my care of the patient were reviewed by me and considered in my medical decision making (see chart for details).    Final Clinical Impressions(s) / UC Diagnoses   Final diagnoses:  Cellulitis of finger of left hand     Discharge Instructions      You were diagnosed with an infection of the finger.  I have given you a shot of an antibiotic here, and sent out a script to your pharmacy to get started on this evening.  Please keep the hand elevated if possible.  You may use motrin  for pain and swelling.  If you notice worsening pain or redness that streaks through the hand to the wrist, then please go to the ER for further evaluation.     ED Prescriptions     Medication Sig Dispense Auth. Provider   amoxicillin -clavulanate (AUGMENTIN ) 875-125 MG tablet Take 1 tablet by mouth every 12 (twelve) hours for 10 days. 20 tablet Darral Longs, MD      PDMP not reviewed this encounter.   Darral Longs, MD 02/25/23 1115

## 2023-02-25 NOTE — ED Triage Notes (Signed)
 Left pointer finger is swollen and pt is having problems flexing it due to swelling and pain. "Hole" in finger has been there since Thanksgiving. Unsure how she sustained the trauma. Denies fevers. Acute swelling started Monday.

## 2023-02-25 NOTE — ED Triage Notes (Signed)
 Pt reports noticed hole on left index finger since thanksgiving but had no problems until yesterday. Having pain and swelling. Denies any drainage.

## 2023-02-25 NOTE — Discharge Instructions (Signed)
 You were diagnosed with an infection of the finger.  I have given you a shot of an antibiotic here, and sent out a script to your pharmacy to get started on this evening.  Please keep the hand elevated if possible.  You may use motrin  for pain and swelling.  If you notice worsening pain or redness that streaks through the hand to the wrist, then please go to the ER for further evaluation.

## 2023-03-14 ENCOUNTER — Ambulatory Visit (HOSPITAL_COMMUNITY)
Admission: EM | Admit: 2023-03-14 | Discharge: 2023-03-14 | Disposition: A | Discharge status: Home / Self Care | Payer: Medicaid Other | Attending: Emergency Medicine | Admitting: Emergency Medicine

## 2023-03-14 ENCOUNTER — Encounter (HOSPITAL_COMMUNITY): Payer: Self-pay

## 2023-03-14 DIAGNOSIS — J101 Influenza due to other identified influenza virus with other respiratory manifestations: Secondary | ICD-10-CM

## 2023-03-14 LAB — POC COVID19/FLU A&B COMBO
Covid Antigen, POC: NEGATIVE
Influenza A Antigen, POC: POSITIVE — AB
Influenza B Antigen, POC: NEGATIVE

## 2023-03-14 MED ORDER — OSELTAMIVIR PHOSPHATE 75 MG PO CAPS: 75.0000 mg | ORAL_CAPSULE | Freq: Two times a day (BID) | ORAL | 0 refills | Status: DC

## 2023-03-14 NOTE — ED Triage Notes (Signed)
Pt reports sore throat.headache and fever x 2 days.

## 2023-03-14 NOTE — Discharge Instructions (Signed)
You have tested positive for flu today. Start taking Tamiflu twice daily for 5 days. Alternate between ibuprofen and Tylenol as needed for body aches and fever. You can take any over-the-counter cough and congestion. Stay hydrated and get plenty of rest. Return here if symptoms persist or worsen.

## 2023-03-14 NOTE — ED Provider Notes (Signed)
[facility]    CSN: [id number] Arrival date & time: 03/14/23  4098      History   Chief Complaint Chief Complaint  Patient presents with   Cough   Headache   Sore Throat    HPI Kelly Willis is a 28 y.o. female.   Patient presents with sore throat, headache, fever, congestion, and cough x 2 days.  Denies abdominal pain, nausea, vomiting, diarrhea, shortness of breath, and chest pain.   Cough Associated symptoms: chills, fever, headaches, rhinorrhea and sore throat   Associated symptoms: no chest pain and no shortness of breath   Headache Associated symptoms: congestion, cough, fatigue, fever and sore throat   Associated symptoms: no abdominal pain, no diarrhea, no dizziness, no nausea, no vomiting and no weakness   Sore Throat Associated symptoms include headaches. Pertinent negatives include no chest pain, no abdominal pain and no shortness of breath.    Past Medical History:  Diagnosis Date   ADHD (attention deficit hyperactivity disorder)    Gestational diabetes    Obesity    Pilonidal abscess 10/16/2012   Pilonidal cyst 06/2014    Patient Active Problem List   Diagnosis Date Noted   History of bilateral tubal ligation 12/10/2019   Vaginal delivery 12/08/2019   Alpha thalassemia silent carrier 10/06/2019   Anemia in pregnancy 09/20/2019   Chronic hypertension affecting pregnancy 09/17/2019   BMI 50.0-59.9, adult (HCC) 09/17/2019   Supervision of high risk pregnancy, antepartum 07/07/2019   Obesity in pregnancy    History of gestational diabetes mellitus 10/20/2017   Lesion of skin of breast 10/20/2017   Limited prenatal care, antepartum 10/20/2017   Weight loss 10/20/2017   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    Past Surgical History:  Procedure Laterality Date   NO PAST SURGERIES     PILONIDAL CYST EXCISION     PILONIDAL CYST EXCISION N/A 07/08/2014   Procedure: CYST EXCISION PILONIDAL ;  Surgeon: [name], MD;  Location: [facility];  Service: General;  Laterality: N/A;   TUBAL LIGATION N/A 12/10/2019   Procedure: POST PARTUM TUBAL LIGATION;  Surgeon: Levie Heritage, DO;  Location: [facility];  Service: Gynecology;  Laterality: N/A;    OB History     Gravida  4   Para  3   Term  3   Preterm  0   AB  1   Living  3      SAB  0   IAB  0   Ectopic  0   Multiple  0   Live Births  3            Home Medications    Prior to Admission medications   Medication Sig Start Date End Date Taking? Authorizing Provider  oseltamivir (TAMIFLU) 75 MG capsule Take 1 capsule (75 mg total) by mouth every 12 (twelve) hours. 03/14/23  Yes Letta Kocher, NP    Family History Family History  Problem Relation Age of Onset   Hypertension Maternal Grandfather     Social History Social History   Tobacco Use   Smoking status: Every Day    Current packs/day: 0.00    Types: Cigarettes    Start date: 04/16/2015    Last attempt to quit: 04/15/2017    Years since quitting: 5.9   Smokeless tobacco: Never   Tobacco comments:    4 cig./day  Vaping Use   Vaping status: Never Used  Substance Use Topics  Alcohol use: Not Currently    Comment: occasionally   Drug use: Not Currently    Types: Marijuana    Comment: daily 1.5 per day     Allergies   Pepto-bismol [bismuth subsalicylate]   Review of Systems Review of Systems  Constitutional:  Positive for chills, fatigue and fever.  HENT:  Positive for congestion, rhinorrhea and sore throat.   Respiratory:  Positive for cough. Negative for chest tightness and shortness of breath.   Cardiovascular:  Negative for chest pain.  Gastrointestinal:  Negative for abdominal pain, diarrhea, nausea and vomiting.  Neurological:  Positive for headaches. Negative for dizziness and weakness.     Physical Exam Triage Vital Signs ED Triage Vitals [03/14/23 0825]  Encounter Vitals Group     BP (!) 138/103      Systolic BP Percentile      Diastolic BP Percentile      Pulse Rate 92     Resp 16     Temp 98.3 F (36.8 C)     Temp Source Oral     SpO2 95 %     Weight      Height      Head Circumference      Peak Flow      Pain Score      Pain Loc      Pain Education      Exclude from Growth Chart    No data found.  Updated Vital Signs BP (!) 138/103 (BP Location: Left Arm)   Pulse 92   Temp 98.3 F (36.8 C) (Oral)   Resp 16   LMP 02/05/2023   SpO2 95%   Visual Acuity Right Eye Distance:   Left Eye Distance:   Bilateral Distance:    Right Eye Near:   Left Eye Near:    Bilateral Near:     Physical Exam Vitals and nursing note reviewed.  Constitutional:      General: She is awake. She is not in acute distress.    Appearance: Normal appearance. She is well-developed and well-groomed. She is not ill-appearing.  HENT:     Right Ear: Tympanic membrane, ear canal and external ear normal.     Left Ear: Tympanic membrane, ear canal and external ear normal.     Nose: Congestion and rhinorrhea present.     Mouth/Throat:     Mouth: Mucous membranes are moist.     Pharynx: Posterior oropharyngeal erythema and postnasal drip present. No oropharyngeal exudate.     Tonsils: No tonsillar exudate.  Cardiovascular:     Rate and Rhythm: Normal rate and regular rhythm.  Pulmonary:     Effort: Pulmonary effort is normal.     Breath sounds: Normal breath sounds.  Musculoskeletal:        General: Normal range of motion.     Cervical back: Normal range of motion and neck supple.  Skin:    General: Skin is warm and dry.  Neurological:     Mental Status: She is alert.     GCS: GCS eye subscore is 4. GCS verbal subscore is 5. GCS motor subscore is 6.  Psychiatric:        Behavior: Behavior is cooperative.      UC Treatments / Results  Labs (all labs ordered are listed, but only abnormal results are displayed) Labs Reviewed  POC COVID19/FLU A&B COMBO - Abnormal; Notable for the  following components:      Result Value   Influenza A Antigen,  POC Positive (*)    All other components within normal limits    EKG   Radiology No results found.  Procedures Procedures (including critical care time)  Medications Ordered in UC Medications - No data to display  Initial Impression / Assessment and Plan / UC Course  I have reviewed the triage vital signs and the nursing notes.  Pertinent labs & imaging results that were available during my care of the patient were reviewed by me and considered in my medical decision making (see chart for details).     Patient presented with 2-day history of sore throat, headache, fever, congestion, and cough.  Upon assessment congestion and rhinorrhea are present, mild erythema and postnasal drip noted to pharynx.  Lungs clear bilaterally on auscultation.  Tested positive for influenza A.  Prescribed Tamiflu.  Discussed over-the-counter medication for symptoms.  Discussed return precautions. Final Clinical Impressions(s) / UC Diagnoses   Final diagnoses:  Influenza A     Discharge Instructions      You have tested positive for flu today. Start taking Tamiflu twice daily for 5 days. Alternate between ibuprofen and Tylenol as needed for body aches and fever. You can take any over-the-counter cough and congestion. Stay hydrated and get plenty of rest. Return here if symptoms persist or worsen.      ED Prescriptions     Medication Sig Dispense Auth. Provider   oseltamivir (TAMIFLU) 75 MG capsule Take 1 capsule (75 mg total) by mouth every 12 (twelve) hours. 10 capsule Wynonia Lawman A, NP      PDMP not reviewed this encounter.   Wynonia Lawman A, [place] 03/14/23 [phone]

## 2023-04-16 ENCOUNTER — Ambulatory Visit (HOSPITAL_COMMUNITY)

## 2023-05-13 ENCOUNTER — Ambulatory Visit (HOSPITAL_COMMUNITY): Payer: Self-pay

## 2023-05-15 ENCOUNTER — Ambulatory Visit (HOSPITAL_COMMUNITY)

## 2023-06-05 ENCOUNTER — Encounter (HOSPITAL_COMMUNITY): Payer: Self-pay

## 2023-06-05 ENCOUNTER — Ambulatory Visit (HOSPITAL_COMMUNITY)
Admission: RE | Admit: 2023-06-05 | Discharge: 2023-06-05 | Disposition: A | Discharge status: Home / Self Care | Payer: Self-pay | Source: Ambulatory Visit | Attending: Family Medicine | Admitting: Family Medicine

## 2023-06-05 ENCOUNTER — Other Ambulatory Visit: Payer: Self-pay

## 2023-06-05 VITALS — BP 137/83 | HR 93 | Temp 98.1°F | Resp 18

## 2023-06-05 DIAGNOSIS — N898 Other specified noninflammatory disorders of vagina: Secondary | ICD-10-CM | POA: Insufficient documentation

## 2023-06-05 MED ORDER — DOXYCYCLINE HYCLATE 100 MG PO CAPS: 100.0000 mg | ORAL_CAPSULE | Freq: Two times a day (BID) | ORAL | 0 refills | Status: DC

## 2023-06-05 MED ORDER — CEFTRIAXONE SODIUM 500 MG IJ SOLR
INTRAMUSCULAR | Status: AC
  Filled 2023-06-05: qty 500

## 2023-06-05 MED ORDER — CEFTRIAXONE SODIUM 500 MG IJ SOLR: 500.0000 mg | INTRAMUSCULAR | Status: DC

## 2023-06-05 MED ORDER — LIDOCAINE HCL (PF) 1 % IJ SOLN
INTRAMUSCULAR | Status: AC
  Filled 2023-06-05: qty 2

## 2023-06-05 NOTE — ED Triage Notes (Signed)
 Patient reports a white cloudy discharge, no itching or irritation.  Denies urinary symptoms.  Denies abdominal pain or back pain

## 2023-06-05 NOTE — ED Provider Notes (Signed)
 [facility]   [id number] 06/05/23 Arrival Time: 0865  ASSESSMENT & PLAN:  1. Vaginal discharge    She is concerned re: STI; new partner.  Meds ordered this encounter  Medications   cefTRIAXone  (ROCEPHIN ) injection 500 mg    Antibiotic Indication::   STD   doxycycline  (VIBRAMYCIN ) 100 MG capsule    Sig: Take 1 capsule (100 mg total) by mouth 2 (two) times daily.    Dispense:  14 capsule    Refill:  0    Labs Reviewed  CERVICOVAGINAL ANCILLARY ONLY   Will notify of any positive results. Instructed to refrain from sexual activity for at least seven days.  Reviewed expectations re: course of current medical issues. Questions answered. Outlined signs and symptoms indicating need for more acute intervention. Patient verbalized understanding. After Visit Summary given.   SUBJECTIVE:  Kelly Willis is a 28 y.o. female who presents with complaint of vaginal discharge. Onset abrupt. First noticed  a couple of days ago . New sexual partner. Worried about STI. Afebrile. No abdominal or pelvic pain. Normal PO intake wihout n/v. No genital rashes or lesions.   Patient's last menstrual period was 05/12/2023 (approximate).   OBJECTIVE:  Vitals:   06/05/23 1007  BP: 137/83  Pulse: 93  Resp: 18  Temp: 98.1 F (36.7 C)  TempSrc: Oral  SpO2: 96%    General appearance: alert, cooperative, appears stated age and no distress Lungs: unlabored respirations; speaks full sentences without difficulty Back: no CVA tenderness; FROM at waist Abdomen: soft, non-tender GU: deferred Skin: warm and dry Psychological: alert and cooperative; normal mood and affect.   Labs Reviewed  CERVICOVAGINAL ANCILLARY ONLY    Allergies  Allergen Reactions   Pepto-Bismol [Bismuth Subsalicylate] Nausea And Vomiting    Past Medical History:  Diagnosis Date   ADHD (attention deficit hyperactivity disorder)    Gestational diabetes    Obesity    Pilonidal abscess 10/16/2012   Pilonidal  cyst 06/2014   Family History  Problem Relation Age of Onset   Hypertension Maternal Grandfather    Social History   Socioeconomic History   Marital status: Single    Spouse name: Not on file   Number of children: 2   Years of education: Not on file   Highest education level: Not on file  Occupational History   Not on file  Tobacco Use   Smoking status: Former    Current packs/day: 0.00    Types: Cigarettes    Start date: 04/16/2015    Quit date: 04/15/2017    Years since quitting: 6.1   Smokeless tobacco: Never   Tobacco comments:    4 cig./day  Vaping Use   Vaping status: Every Day  Substance and Sexual Activity   Alcohol use: Not Currently    Comment: occasionally   Drug use: Not Currently    Types: Marijuana    Comment: daily 1.5 per day   Sexual activity: Yes    Partners: Male    Birth control/protection: None  Other Topics Concern   Not on file  Social History Narrative   Not on file   Social Drivers of Health   Financial Resource Strain: Not on file  Food Insecurity: Food Insecurity Present (07/16/2019)   Hunger Vital Sign    Worried About Running Out of Food in the Last Year: Sometimes true    Ran Out of Food in the Last Year: Often true  Transportation Needs: Unmet Transportation Needs (07/16/2019)   PRAPARE - Transportation  Lack of Transportation (Medical): No    Lack of Transportation (Non-Medical): Yes  Physical Activity: Not on file  Stress: Not on file  Social Connections: Not on file  Intimate Partner Violence: Not on file           Afton Albright, MD 06/05/23 [phone]

## 2023-06-06 ENCOUNTER — Telehealth: Payer: Self-pay

## 2023-06-06 LAB — CERVICOVAGINAL ANCILLARY ONLY
Bacterial Vaginitis (gardnerella): POSITIVE — AB
Candida Glabrata: NEGATIVE
Candida Vaginitis: NEGATIVE
Chlamydia: NEGATIVE
Comment: NEGATIVE
Comment: NEGATIVE
Comment: NEGATIVE
Comment: NEGATIVE
Comment: NEGATIVE
Comment: NORMAL
Neisseria Gonorrhea: NEGATIVE
Trichomonas: NEGATIVE

## 2023-06-06 MED ORDER — METRONIDAZOLE 500 MG PO TABS: 500.0000 mg | ORAL_TABLET | Freq: Two times a day (BID) | ORAL | 0 refills | Status: AC

## 2023-06-06 NOTE — Telephone Encounter (Signed)
 Per protocol, pt requires tx with metronidazole. Rx sent to pharmacy on file.

## 2023-08-06 ENCOUNTER — Ambulatory Visit (HOSPITAL_COMMUNITY): Payer: Self-pay

## 2023-08-27 ENCOUNTER — Encounter (HOSPITAL_COMMUNITY): Payer: Self-pay

## 2023-08-27 ENCOUNTER — Ambulatory Visit (HOSPITAL_COMMUNITY)
Admission: RE | Admit: 2023-08-27 | Discharge: 2023-08-27 | Disposition: A | Discharge status: Home / Self Care | Payer: Self-pay | Source: Ambulatory Visit | Attending: Physician Assistant | Admitting: Physician Assistant

## 2023-08-27 ENCOUNTER — Other Ambulatory Visit: Payer: Self-pay

## 2023-08-27 VITALS — BP 126/83 | HR 72 | Temp 97.9°F | Resp 20

## 2023-08-27 DIAGNOSIS — N760 Acute vaginitis: Secondary | ICD-10-CM | POA: Diagnosis not present

## 2023-08-27 HISTORY — DX: Acute vaginitis: N76.0

## 2023-08-27 LAB — CERVICOVAGINAL ANCILLARY ONLY
Bacterial Vaginitis (gardnerella): NEGATIVE
Candida Glabrata: NEGATIVE
Candida Vaginitis: POSITIVE — AB
Chlamydia: NEGATIVE
Comment: NEGATIVE
Comment: NEGATIVE
Comment: NEGATIVE
Comment: NEGATIVE
Comment: NEGATIVE
Comment: NORMAL
Neisseria Gonorrhea: NEGATIVE
Trichomonas: NEGATIVE

## 2023-08-27 MED ORDER — FLUCONAZOLE 150 MG PO TABS: 150.0000 mg | ORAL_TABLET | ORAL | 0 refills | Status: AC | PRN

## 2023-08-27 NOTE — ED Provider Notes (Signed)
 [facility]    CSN: [id number] Arrival date & time: 08/27/23  0844      History   Chief Complaint Chief Complaint  Patient presents with   Vaginal Irritation    HPI Kelly Willis is a 28 y.o. female.   Patient presents today with a 1 week history of vaginal irritation.  She describes this as a burning and itching sensation.  She has not had any vaginal discharge and denies additional symptoms including fever, abdominal pain, nausea, vomiting, pelvic pain.  She does report that she changed her soap and believes this triggered her symptoms.  Denies any additional changes to her personal hygiene products.  Denies any recent medication changes or antibiotic use.  She does not take an SGLT2 inhibitor.  She is interested in STI testing but declined HIV, hepatitis, syphilis testing.  She is confident she is not pregnant as she just had her menstrual cycle a few weeks ago and has had a tubal ligation.    Past Medical History:  Diagnosis Date   ADHD (attention deficit hyperactivity disorder)    BV (bacterial vaginosis)    Gestational diabetes    Obesity    Pilonidal abscess 10/16/2012   Pilonidal cyst 06/2014    Patient Active Problem List   Diagnosis Date Noted   History of bilateral tubal ligation 12/10/2019   Vaginal delivery 12/08/2019   Alpha thalassemia silent carrier 10/06/2019   Anemia in pregnancy 09/20/2019   Chronic hypertension affecting pregnancy 09/17/2019   BMI 50.0-59.9, adult (HCC) 09/17/2019   Supervision of high risk pregnancy, antepartum 07/07/2019   Obesity in pregnancy    History of gestational diabetes mellitus 10/20/2017   Lesion of skin of breast 10/20/2017   Limited prenatal care, antepartum 10/20/2017   Weight loss 10/20/2017   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    Past Surgical History:  Procedure Laterality Date   PILONIDAL CYST EXCISION     PILONIDAL CYST EXCISION N/A 07/08/2014   Procedure:  CYST EXCISION PILONIDAL ;  Surgeon: Krystal Russell, MD;  Location: [facility];  Service: General;  Laterality: N/A;   TUBAL LIGATION N/A 12/10/2019   Procedure: POST PARTUM TUBAL LIGATION;  Surgeon: Barbra Lang PARAS, DO;  Location: [facility];  Service: Gynecology;  Laterality: N/A;    OB History     Gravida  4   Para  3   Term  3   Preterm  0   AB  1   Living  3      SAB  0   IAB  0   Ectopic  0   Multiple  0   Live Births  3            Home Medications    Prior to Admission medications   Medication Sig Start Date End Date Taking? Authorizing Provider  fluconazole  (DIFLUCAN ) 150 MG tablet Take 1 tablet (150 mg total) by mouth every 3 (three) days as needed for up to 2 doses. 08/27/23  Yes Ayslin Kundert, Rocky POUR, PA-C    Family History Family History  Problem Relation Age of Onset   Hypertension Maternal Grandfather     Social History Social History   Tobacco Use   Smoking status: Former    Current packs/day: 0.00    Types: Cigarettes    Start date: 04/16/2015    Quit date: 04/15/2017    Years since quitting: 6.3   Smokeless tobacco: Never   Tobacco comments:  4 cig./day  Vaping Use   Vaping status: Every Day  Substance Use Topics   Alcohol use: Not Currently    Comment: occasionally   Drug use: Not Currently    Types: Marijuana     Allergies   Pepto-bismol [bismuth subsalicylate]   Review of Systems Review of Systems  Constitutional:  Negative for activity change, appetite change, fatigue and fever.  Gastrointestinal:  Negative for abdominal pain, diarrhea, nausea and vomiting.  Genitourinary:  Positive for vaginal pain. Negative for dysuria, frequency, pelvic pain, urgency, vaginal bleeding and vaginal discharge.     Physical Exam Triage Vital Signs ED Triage Vitals  Encounter Vitals Group     BP 08/27/23 0917 126/83     Girls Systolic BP Percentile --      Girls Diastolic BP Percentile --      Boys Systolic BP  Percentile --      Boys Diastolic BP Percentile --      Pulse Rate 08/27/23 0917 72     Resp 08/27/23 0917 20     Temp 08/27/23 0917 97.9 F (36.6 C)     Temp Source 08/27/23 0917 Oral     SpO2 08/27/23 0917 97 %     Weight --      Height --      Head Circumference --      Peak Flow --      Pain Score 08/27/23 0918 7     Pain Loc --      Pain Education --      Exclude from Growth Chart --    No data found.  Updated Vital Signs BP 126/83   Pulse 72   Temp 97.9 F (36.6 C) (Oral)   Resp 20   LMP 08/08/2023 (Approximate)   SpO2 97%   Visual Acuity Right Eye Distance:   Left Eye Distance:   Bilateral Distance:    Right Eye Near:   Left Eye Near:    Bilateral Near:     Physical Exam Vitals reviewed.  Constitutional:      General: She is awake. She is not in acute distress.    Appearance: Normal appearance. She is well-developed. She is not ill-appearing.     Comments: Very pleasant female appears stated age in no acute distress  HENT:     Head: Normocephalic and atraumatic.  Cardiovascular:     Rate and Rhythm: Normal rate and regular rhythm.     Heart sounds: Normal heart sounds, S1 normal and S2 normal. No murmur heard. Pulmonary:     Effort: Pulmonary effort is normal.     Breath sounds: Normal breath sounds. No wheezing, rhonchi or rales.     Comments: Clear to auscultation bilaterally Abdominal:     Palpations: Abdomen is soft.     Tenderness: There is no abdominal tenderness. There is no right CVA tenderness or left CVA tenderness.  Psychiatric:        Behavior: Behavior is cooperative.      UC Treatments / Results  Labs (all labs ordered are listed, but only abnormal results are displayed) Labs Reviewed  CERVICOVAGINAL ANCILLARY ONLY    EKG   Radiology No results found.  Procedures Procedures (including critical care time)  Medications Ordered in UC Medications - No data to display  Initial Impression / Assessment and Plan / UC Course   I have reviewed the triage vital signs and the nursing notes.  Pertinent labs & imaging results that were available during my  care of the patient were reviewed by me and considered in my medical decision making (see chart for details).     Patient is well-appearing, afebrile, nontoxic, nontachycardic.  Vital signs of physical exam are reassuring.  STI swab was collected.  Will empirically treat for yeast given her clinical presentation and 2 doses of Diflucan  were sent with instruction to take first tablet today and second tablet in 3 days if symptoms persist.  She is to wear loosefitting cotton underwear and use hypoallergenic soaps and detergents.  We discussed that if she has any worsening or changing symptoms including pelvic pain, abnormal discharge, fever, nausea, vomiting she needs to be seen immediately.  Strict return precautions given.  Excuse note declined.  Final Clinical Impressions(s) / UC Diagnoses   Final diagnoses:  Acute vaginitis     Discharge Instructions      We are treating you for a yeast infection.  Take Diflucan  today and a second dose in 3 days.  We will contact you if we need to arrange additional treatment based on your swab results.  Wear loosefitting cotton underwear and use hypoallergenic soaps and detergents.  If develop any pelvic pain, abdominal pain, fever, nausea, vomiting you should be seen immediately.     ED Prescriptions     Medication Sig Dispense Auth. Provider   fluconazole  (DIFLUCAN ) 150 MG tablet Take 1 tablet (150 mg total) by mouth every 3 (three) days as needed for up to 2 doses. 2 tablet Samie Barclift K, PA-C      PDMP not reviewed this encounter.   Sherrell Rocky POUR, PA-C 08/27/23 1008

## 2023-08-27 NOTE — ED Triage Notes (Addendum)
 C/O vaginal irritation onset 1 wk ago. Denies vaginal discharge. Reports hx frequent BV.

## 2023-08-27 NOTE — Discharge Instructions (Signed)
 We are treating you for a yeast infection.  Take Diflucan  today and a second dose in 3 days.  We will contact you if we need to arrange additional treatment based on your swab results.  Wear loosefitting cotton underwear and use hypoallergenic soaps and detergents.  If develop any pelvic pain, abdominal pain, fever, nausea, vomiting you should be seen immediately.
